# Patient Record
Sex: Male | Born: 1985 | Race: White | Hispanic: Yes | Marital: Married | Smoking: Never smoker
Health system: Southern US, Community
[De-identification: ages and names within clinical notes are randomized; demographics above are authoritative.]

## PROBLEM LIST (undated history)

## (undated) DIAGNOSIS — T7840XA Allergy, unspecified, initial encounter: Secondary | ICD-10-CM

## (undated) DIAGNOSIS — F419 Anxiety disorder, unspecified: Secondary | ICD-10-CM

## (undated) HISTORY — DX: Allergy, unspecified, initial encounter: T78.40XA

## (undated) HISTORY — DX: Anxiety disorder, unspecified: F41.9

---
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
## 2016-06-04 DIAGNOSIS — G8929 Other chronic pain: Secondary | ICD-10-CM | POA: Insufficient documentation

## 2016-06-04 DIAGNOSIS — R109 Unspecified abdominal pain: Secondary | ICD-10-CM

## 2016-06-09 DIAGNOSIS — Z7689 Persons encountering health services in other specified circumstances: Secondary | ICD-10-CM | POA: Diagnosis not present

## 2016-06-09 DIAGNOSIS — J069 Acute upper respiratory infection, unspecified: Secondary | ICD-10-CM | POA: Diagnosis not present

## 2016-06-19 DIAGNOSIS — R05 Cough: Secondary | ICD-10-CM | POA: Diagnosis not present

## 2016-06-19 DIAGNOSIS — F411 Generalized anxiety disorder: Secondary | ICD-10-CM | POA: Diagnosis not present

## 2016-06-19 DIAGNOSIS — R454 Irritability and anger: Secondary | ICD-10-CM | POA: Diagnosis not present

## 2016-06-19 DIAGNOSIS — Z201 Contact with and (suspected) exposure to tuberculosis: Secondary | ICD-10-CM | POA: Diagnosis not present

## 2016-08-10 ENCOUNTER — Ambulatory Visit (INDEPENDENT_AMBULATORY_CARE_PROVIDER_SITE_OTHER): Payer: BLUE CROSS/BLUE SHIELD | Admitting: Physician Assistant

## 2016-08-10 ENCOUNTER — Encounter: Payer: Self-pay | Admitting: Physician Assistant

## 2016-08-10 VITALS — BP 120/80 | HR 67 | Temp 98.5°F | Ht 70.0 in | Wt 158.5 lb

## 2016-08-10 DIAGNOSIS — K219 Gastro-esophageal reflux disease without esophagitis: Secondary | ICD-10-CM | POA: Diagnosis not present

## 2016-08-10 DIAGNOSIS — F419 Anxiety disorder, unspecified: Secondary | ICD-10-CM | POA: Insufficient documentation

## 2016-08-10 DIAGNOSIS — R053 Chronic cough: Secondary | ICD-10-CM | POA: Insufficient documentation

## 2016-08-10 DIAGNOSIS — R05 Cough: Secondary | ICD-10-CM | POA: Diagnosis not present

## 2016-08-10 DIAGNOSIS — T7840XA Allergy, unspecified, initial encounter: Secondary | ICD-10-CM

## 2016-08-10 MED ORDER — RANITIDINE HCL 150 MG PO TABS: 150.0000 mg | ORAL_TABLET | Freq: Two times a day (BID) | ORAL | 1 refills | Status: DC

## 2016-08-10 MED ORDER — SERTRALINE HCL 25 MG PO TABS
25.0000 mg | ORAL_TABLET | Freq: Every day | ORAL | 0 refills | Status: DC
Start: 2016-08-10 — End: 2016-09-04

## 2016-08-10 NOTE — Assessment & Plan Note (Signed)
Uncontrolled. Avoid triggers which appear to be meat and spicy foods. Continue fiber rich foods and supplement. Also add probiotic to help with bloating. Ranitidine 150 mg BID. Follow-up in 1 month.

## 2016-08-10 NOTE — Assessment & Plan Note (Signed)
Take daily antihistamine to help with post-nasal drip as this may be contributing to cough.

## 2016-08-10 NOTE — Assessment & Plan Note (Signed)
Overall improved, however patient is requesting reassurance. Will start Zantac and daily antihistamine. Referral to pulmonology for further evaluation and treatment. Most recent labs in March 2018 normal, as well as xray.

## 2016-08-10 NOTE — Progress Notes (Signed)
Leslie Rollins is a 31 y.o. male here to Establish Care and discuss anxiety issue, cough and abdominal bloating.  I acted as a Neurosurgeonscribe for [employer] CorporationSamantha Courtland Reas, PA-C Corky Mullonna Orphanos, LPN  History of Present Illness:   Chief Complaint  Patient presents with  . Establish Care  . Anxiety  . Bloated    x few years, diarrhea off and on  . Cough    x 3 months, expectorating clear to yellow sputum    Acute Concerns: GERD -- he notes that over the past few years, he notices that he has heartburn/bloating/diarrhea when he eats meats and spicy foods, will have a few episodes after just one meal of spicy foods or meat, if he avoids theses foods he doesn't have symptoms, he is trying to transition to veganism, he eats a lot of fruits and vegetables and does take at least one scoop of Metamucil daily to prevent constipation and hemorrhoids -- both of which he had deal with in the past, no current concerns for hemorrhoids, denies any rectal bleeding or weight loss  Chronic Issues: Cough -- has had cough for about 3 months since [place], while he was there he had an exposure to a niece with TB, his xray was negative on 06/19/16, was given amoxicillin as he as having low-grade fevers at one point. His cough has significantly improved however he is concerned because it has not resolved, he still has coughing spells 2-3 times a day. He has tried taking Mucinex and anti-histamines but doesn't help with the cough consistently and he doesn't like to take daily medications. Denies significant weight loss (usually weighs around 155-165 lb), no coughing up blood. Has "always" had night sweats. CBC, CMP in March 2018 were normal. Anxiety -- has been dealing with for years, has been prescribed a total of 3 medications, one of which was Lexapro 10mg  -- took it once and it caused a migraine, the other two medications he cannot remember. He is dealing with marriage struggles, denies suicidal thoughts or  homicidal thoughts, does raise his voice, son is autistic. [facility] but never went. He is agreeable to therapy and trying a medication to help with his anxiety. CBC, CMP and TSH in March 2018 were normal.  Health Maintenance: Immunizations -- UTD, we are requesting his records Colonoscopy -- none Diet -- very occasional cheese and meat, but tries to eat mostly vegan Caffeine intake -- limits coffee because it can cause anxiety symptoms, states that he can drink "2 red bulls and feel fine" Sleep habits -- has occasional insomnia with his anxiety Weight -- Weight: 158 lb 8 oz (71.9 kg)  Mood -- anxiety, no depression  Depression screen PHQ 2/9 08/10/2016  Decreased Interest 0  Down, Depressed, Hopeless 0  PHQ - 2 Score 0    Other providers/specialists: None   PMHx, SurgHx, SocialHx, Medications, and Allergies were reviewed in the Visit Navigator and updated as appropriate.  Current Medications:   Current Outpatient Prescriptions:  .  acetaminophen (TYLENOL) 500 MG tablet, Take 500-1,000 mg by mouth as needed., Disp: , Rfl:  .  cetirizine (ZYRTEC) 10 MG tablet, Take 10 mg by mouth as needed for allergies., Disp: , Rfl:  .  ranitidine (ZANTAC) 150 MG tablet, Take 1 tablet (150 mg total) by mouth 2 (two) times daily., Disp: 60 tablet, Rfl: 1 .  sertraline (ZOLOFT) 25 MG tablet, Take 1 tablet (25 mg total) by mouth daily.,  Disp: 30 tablet, Rfl: 0   Review of Systems:   Review of Systems  Constitutional: Negative.  Negative for chills, fever, malaise/fatigue and weight loss.  HENT:       Bilateral pressure in both ears, hears whistling off and on.  Eyes: Negative for blurred vision and redness.       Allergies  Respiratory: Positive for cough and sputum production.   Cardiovascular: Negative.   Gastrointestinal: Positive for diarrhea and heartburn. Negative for blood in stool, constipation and melena.       Bloating   Genitourinary: Negative.   Negative for dysuria.  Skin: Negative.   Neurological: Negative.   Endo/Heme/Allergies: Positive for environmental allergies.  Psychiatric/Behavioral: Negative for depression. The patient is nervous/anxious and has insomnia.     Vitals:   Vitals:   08/10/16 1453  BP: 120/80  Pulse: 67  Temp: 98.5 F (36.9 C)  TempSrc: Oral  SpO2: 96%  Weight: 158 lb 8 oz (71.9 kg)  Height: 5\' 10"  (1.778 m)     Body mass index is 22.74 kg/m.  Physical Exam:   Physical Exam  Constitutional: He appears well-developed. He is cooperative.  Non-toxic appearance. He does not have a sickly appearance. He does not appear ill. No distress.  Cardiovascular: Normal rate, regular rhythm, S1 normal, S2 normal, normal heart sounds and normal pulses.   No LE edema  Pulmonary/Chest: Effort normal and breath sounds normal.  Abdominal: Bowel sounds are normal. There is no tenderness. There is no rigidity, no rebound and no guarding.  Neurological: He is alert.  Nursing note and vitals reviewed.     Assessment and Plan:    Problem List Items Addressed This Visit      Digestive   Gastroesophageal reflux disease    Uncontrolled. Avoid triggers which appear to be meat and spicy foods. Continue fiber rich foods and supplement. Also add probiotic to help with bloating. Ranitidine 150 mg BID. Follow-up in 1 month.      Relevant Medications   ranitidine (ZANTAC) 150 MG tablet     Other   Anxiety    Un-controlled. Start Zoloft 25 mg daily. Follow-up with [place] in 1 month. Most recent labwork completed in March was normal, including TSH. I think that he would benefit from therapy, I encouraged he follow-up with [facility] referral, or I have given him Colen Darling' contact info.      Relevant Medications   sertraline (ZOLOFT) 25 MG tablet   Allergy    Take daily antihistamine to help with post-nasal drip as this may be contributing to cough.      Cough, persistent - Primary    Overall improved, however  patient is requesting reassurance. Will start Zantac and daily antihistamine. Referral to pulmonology for further evaluation and treatment. Most recent labs in March 2018 normal, as well as xray.      Relevant Orders   Ambulatory referral to Pulmonology      . Reviewed expectations re: course of current medical issues. . Discussed self-management of symptoms. . Outlined signs and symptoms indicating need for more acute intervention. . Patient verbalized understanding and all questions were answered. . See orders for this visit as documented in the electronic medical record. . Patient received an After-Visit Summary.  CMA or LPN served as scribe during this visit. History, Physical, and Plan performed by medical provider. Documentation and orders reviewed and attested to.  Jarold Motto, PA-C

## 2016-08-10 NOTE — Patient Instructions (Addendum)
It was great meeting you today!  You will be contacted about your referral to pulmonology.  Please go to the [facility] as we discussed for your therapy appointment --> [facility]  [address]  [place], [place] [id number]   OR please call Misty StanleyLisa, our therapist at [phone] to schedule an appointment.  We will see you in 1 month to check on your mood.  Start 25 mg Zoloft daily at night. Start 150 mg Ranitidine twice daily for heartburn. Start a daily antihistamine, such as Allegra, Claritin or Zyrtec Start a daily probiotic, such as Florastor, Vulturell or [employer].

## 2016-08-10 NOTE — Assessment & Plan Note (Addendum)
Un-controlled. Start Zoloft 25 mg daily. Follow-up with us in 1 month. Most recent labwork completed in March was normal, including TSH. I think that he would benefit from therapy, I encouraged he follow-up with [facility] referral, or I have given him Colen DarlingLisa Flores' contact info.

## 2016-09-03 NOTE — Progress Notes (Addendum)
Leslie Rollins is a 31 y.o. male is here to follow up on GERD and Anxiety.  I acted as a Neurosurgeon for [employer], PA-C Corky Mull, LPN  History of Present Illness:   Chief Complaint  Patient presents with  . Follow-up  . Gastroesophageal Reflux  . Anxiety    Gastroesophageal Reflux  He complains of belching, heartburn and nausea. He reports no choking, no dysphagia or no sore throat. Some heartburn but is better, sweating palms. This is a chronic problem. The problem occurs occasionally. The problem has been gradually improving. The heartburn duration is several minutes. The heartburn is located in the substernum. The heartburn is of moderate intensity. The heartburn does not wake him from sleep. The heartburn does not limit his activity. The heartburn doesn't change with position. The symptoms are aggravated by certain foods. Pertinent negatives include no weight loss. He has tried an antacid and a diet change (using Zantac as prescribed - twice daily; he is working on continual avoidance of meats as he transitions to veganism; he is also taking a probiotic that has helpled) for the symptoms. The treatment provided significant relief.  Anxiety  Presents for follow-up visit. Symptoms include nausea, nervous/anxious behavior and shortness of breath. Patient reports no decreased concentration, depressed mood, dizziness or dry mouth. Primary symptoms comment: Some heartburn but is better, sweating palms. Symptoms occur occasionally. The most recent episode lasted 5 minutes. The severity of symptoms is mild. The quality of sleep is fair. Nighttime awakenings: several.   Compliance with medications: taking 25mg  Zoloft daily and is responding well to treatment.   GAD 7 : Generalized Anxiety Score 09/04/2016  Nervous, Anxious, on Edge 1  Control/stop worrying 0  Worry too much - different things 1  Trouble relaxing 0  Restless 0  Easily annoyed or irritable 1  Afraid - awful  might happen 1  Total GAD 7 Score 4  Anxiety Difficulty Not difficult at all    Flank Pain Last Friday, drove to [place], had significant urinary urgency. Has never happened to him before. Had some pain on lower L side 10 minutes for severe and then dull for a days after this, approximately 2/10 on the pain scale. Two days ago his pain went away completely. 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 He does report that he has noticed that his urine was "very bright yellow" and endorses that he has had at least 6 Monster energy drinks over the weekend, but that this urine became clear once he switched to water. Denies penile discharge or lesions. He denies fevers, chills, nausea, issues with urinary burning/stinging or blood in urine. He denies history of kidney stones.  Additionally, he and his wife are traveling out of the country for the next month and a half and will be leaving in 3 days.   Health Maintenance Due  Topic Date Due  . HIV Screening  06/05/1998  . TETANUS/TDAP  06/05/2002    PMHx, SurgHx, SocialHx, FamHx, Medications, and Allergies were reviewed in the Visit Navigator and updated as appropriate.   Patient Active Problem List   Diagnosis Date Noted  . Anxiety 08/10/2016  . Allergy 08/10/2016  . Cough, persistent 08/10/2016  . Gastroesophageal reflux disease 08/10/2016    Social History  Substance Use Topics  . Smoking status: Never Smoker  . Smokeless tobacco: Never Used  . Alcohol use 2.4 - 3.6 oz/week  4 - 6 Cans of beer per week    Current Medications and Allergies:    Current Outpatient Prescriptions:  .  acetaminophen (TYLENOL) 500 MG tablet, Take 500-1,000 mg by mouth as needed., Disp: , Rfl:  .  cetirizine (ZYRTEC) 10 MG tablet, Take 10 mg by mouth as needed for allergies., Disp: , Rfl:  .  ranitidine (ZANTAC) 150 MG tablet, Take 1 tablet (150 mg total) by mouth at  bedtime., Disp: 90 tablet, Rfl: 1 .  sertraline (ZOLOFT) 50 MG tablet, Take 1 tablet (50 mg total) by mouth at bedtime., Disp: 90 tablet, Rfl: 1  No Known Allergies  Review of Systems   Review of Systems  Constitutional: Negative for chills, fever, malaise/fatigue and weight loss.  HENT: Negative for sore throat.   Respiratory: Positive for shortness of breath. Negative for choking.   Gastrointestinal: Positive for heartburn and nausea. Negative for dysphagia.  Genitourinary: Positive for flank pain and urgency. Negative for dysuria, frequency and hematuria.  Neurological: Negative for dizziness and headaches.  Psychiatric/Behavioral: Negative for decreased concentration. The patient is nervous/anxious.     Vitals:   Vitals:   09/04/16 1051  BP: 110/70  Pulse: (!) 54  Temp: 98.1 F (36.7 C)  TempSrc: Oral  SpO2: 98%  Weight: 156 lb 4 oz (70.9 kg)  Height: 5\' 10"  (1.778 m)     Body mass index is 22.42 kg/m.   Physical Exam:    Physical Exam  Constitutional: He appears well-developed. He is cooperative.  Non-toxic appearance. He does not have a sickly appearance. He does not appear ill. No distress.  Cardiovascular: Normal rate, regular rhythm, S1 normal, S2 normal, normal heart sounds and normal pulses.   No LE edema  Pulmonary/Chest: Effort normal and breath sounds normal.  Abdominal: There is no tenderness. There is CVA tenderness. There is no rigidity, no rebound and no guarding.  Possible tenderness with palpation of L-sided CVA  Musculoskeletal:  Tenderness to L lower back, no bony tenderness/rash/erythema present, no active spasm felt  Neurological: He is alert.  Nursing note and vitals reviewed.   Results for orders placed or performed in visit on 09/04/16  POCT urinalysis dipstick  Result Value Ref Range   Color, UA Yellow    Clarity, UA Clear    Glucose, UA Negative    Bilirubin, UA Negative    Ketones, UA Negative    Spec Grav, UA 1.010 1.010 - 1.025     Blood, UA Negative    pH, UA 6.0 5.0 - 8.0   Protein, UA Negative    Urobilinogen, UA 0.2 0.2 or 1.0 E.U./dL   Nitrite, UA Negative    Leukocytes, UA Negative Negative     Assessment and Plan:    Leslie Rollins was seen today for follow-up, gastroesophageal reflux and anxiety.  Diagnoses and all orders for this visit:  Dysuria POCT UA without abnormalities. He is currently asymptomatic. He is agreeable to cytology testing for GC/chlamydia. I discussed avoidance of Monster energy drinks and pushing fluids. I advised patient to let us know if symptoms return. -     POCT urinalysis dipstick -     Urine cytology ancillary only  Left flank pain POCT UA without abnormalities. He is currently asymptomatic. I suspect that patient had some sort of possible muscle strain. Additionally, I advised him to push fluids. May continue prn ibuprofen to help with symptoms. 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  I advised patient to let us know if symptoms return. -     POCT urinalysis dipstick -     Urine cytology ancillary only  Anxiety Well-controlled, however he would like to see if he can get any additional benefit from increase Zoloft dosage to 50 mg. I am agreeable to trying this. I have refilled the medication for him to take at 50 mg and advised him that while traveling he may decrease to 25 mg by splitting pills in half if he feels as though that is a better dosage for him. Avoid further energy drink intake.  Gastroesophageal reflux disease, esophagitis presence not specified Significantly improved with diet changes and zantac. I advised patient to decrease Zantac to 150 mg daily. Continue diet changes. Continue probiotic.  Other orders -     sertraline (ZOLOFT) 50 MG tablet; Take 1 tablet (50 mg total) by mouth at bedtime. -     ranitidine (ZANTAC) 150 MG tablet; Take 1 tablet (150 mg total) by mouth at  bedtime.  . Reviewed expectations re: course of current medical issues. . Discussed self-management of symptoms. . Outlined signs and symptoms indicating need for more acute intervention. . Patient verbalized understanding and all questions were answered. . See orders for this visit as documented in the electronic medical record. . Patient received an After Visit Summary.  CMA or LPN served as scribe during this visit. History, Physical, and Plan performed by medical provider. Documentation and orders reviewed and attested to.  Jarold MottoSamantha Treena Cosman, PA-C [place] [place] 09/04/2016  Follow-up: No Follow-up on file.

## 2016-09-04 ENCOUNTER — Encounter: Payer: Self-pay | Admitting: Physician Assistant

## 2016-09-04 ENCOUNTER — Other Ambulatory Visit (HOSPITAL_COMMUNITY)
Admission: RE | Admit: 2016-09-04 | Discharge: 2016-09-04 | Disposition: A | Discharge status: Home / Self Care | Payer: BLUE CROSS/BLUE SHIELD | Source: Ambulatory Visit | Attending: Physician Assistant | Admitting: Physician Assistant

## 2016-09-04 ENCOUNTER — Ambulatory Visit (INDEPENDENT_AMBULATORY_CARE_PROVIDER_SITE_OTHER): Payer: BLUE CROSS/BLUE SHIELD | Admitting: Physician Assistant

## 2016-09-04 VITALS — BP 110/70 | HR 54 | Temp 98.1°F | Ht 70.0 in | Wt 156.2 lb

## 2016-09-04 DIAGNOSIS — R300 Dysuria: Secondary | ICD-10-CM | POA: Insufficient documentation

## 2016-09-04 DIAGNOSIS — F419 Anxiety disorder, unspecified: Secondary | ICD-10-CM

## 2016-09-04 DIAGNOSIS — K219 Gastro-esophageal reflux disease without esophagitis: Secondary | ICD-10-CM

## 2016-09-04 DIAGNOSIS — R109 Unspecified abdominal pain: Secondary | ICD-10-CM | POA: Insufficient documentation

## 2016-09-04 LAB — POCT URINALYSIS DIPSTICK
Bilirubin, UA: NEGATIVE
Glucose, UA: NEGATIVE
KETONES UA: NEGATIVE
Leukocytes, UA: NEGATIVE
Nitrite, UA: NEGATIVE
PH UA: 6 (ref 5.0–8.0)
PROTEIN UA: NEGATIVE
RBC UA: NEGATIVE
SPEC GRAV UA: 1.01 (ref 1.010–1.025)
UROBILINOGEN UA: 0.2 U/dL

## 2016-09-04 MED ORDER — RANITIDINE HCL 150 MG PO TABS: 150.0000 mg | ORAL_TABLET | Freq: Every day | ORAL | 1 refills | Status: DC

## 2016-09-04 MED ORDER — SERTRALINE HCL 50 MG PO TABS: 50.0000 mg | ORAL_TABLET | Freq: Every day | ORAL | 1 refills | Status: DC

## 2016-09-04 NOTE — Patient Instructions (Addendum)
Decrease Zantac to 150 mg daily.  Increase Zoloft to 50 mg daily, if you feel that this is too much, you may cut the pill and take half daily while on your trip.  If your pain worsens in any way or if you experience changes in your urine, please let us know.

## 2016-09-07 LAB — URINE CYTOLOGY ANCILLARY ONLY
Chlamydia: NEGATIVE
Neisseria Gonorrhea: NEGATIVE

## 2016-11-17 ENCOUNTER — Telehealth: Payer: Self-pay | Admitting: Physician Assistant

## 2016-11-17 ENCOUNTER — Encounter: Payer: Self-pay | Admitting: Physician Assistant

## 2016-11-17 ENCOUNTER — Ambulatory Visit (INDEPENDENT_AMBULATORY_CARE_PROVIDER_SITE_OTHER): Payer: BLUE CROSS/BLUE SHIELD

## 2016-11-17 ENCOUNTER — Ambulatory Visit (INDEPENDENT_AMBULATORY_CARE_PROVIDER_SITE_OTHER): Payer: BLUE CROSS/BLUE SHIELD | Admitting: Physician Assistant

## 2016-11-17 VITALS — BP 120/70 | HR 60 | Temp 98.7°F | Ht 70.0 in | Wt 163.0 lb

## 2016-11-17 DIAGNOSIS — R109 Unspecified abdominal pain: Secondary | ICD-10-CM | POA: Diagnosis not present

## 2016-11-17 DIAGNOSIS — R079 Chest pain, unspecified: Secondary | ICD-10-CM

## 2016-11-17 DIAGNOSIS — F419 Anxiety disorder, unspecified: Secondary | ICD-10-CM

## 2016-11-17 LAB — POCT URINALYSIS DIPSTICK
Bilirubin, UA: NEGATIVE
Blood, UA: NEGATIVE
GLUCOSE UA: NEGATIVE
Ketones, UA: NEGATIVE
Leukocytes, UA: NEGATIVE
NITRITE UA: NEGATIVE
PH UA: 6.5 (ref 5.0–8.0)
PROTEIN UA: NEGATIVE
Urobilinogen, UA: 0.2 E.U./dL

## 2016-11-17 LAB — CBC WITH DIFFERENTIAL/PLATELET
BASOS ABS: 0.1 10*3/uL (ref 0.0–0.1)
Basophils Relative: 0.8 % (ref 0.0–3.0)
EOS ABS: 0.2 10*3/uL (ref 0.0–0.7)
Eosinophils Relative: 3.2 % (ref 0.0–5.0)
HEMATOCRIT: 44.5 % (ref 39.0–52.0)
HEMOGLOBIN: 15 g/dL (ref 13.0–17.0)
LYMPHS PCT: 28.6 % (ref 12.0–46.0)
Lymphs Abs: 1.8 10*3/uL (ref 0.7–4.0)
MCHC: 33.7 g/dL (ref 30.0–36.0)
MCV: 93.6 fl (ref 78.0–100.0)
Monocytes Absolute: 0.4 10*3/uL (ref 0.1–1.0)
Monocytes Relative: 5.8 % (ref 3.0–12.0)
Neutro Abs: 4 10*3/uL (ref 1.4–7.7)
Neutrophils Relative %: 61.6 % (ref 43.0–77.0)
Platelets: 175 10*3/uL (ref 150.0–400.0)
RBC: 4.76 Mil/uL (ref 4.22–5.81)
RDW: 12 % (ref 11.5–15.5)
WBC: 6.5 10*3/uL (ref 4.0–10.5)

## 2016-11-17 LAB — COMPREHENSIVE METABOLIC PANEL
ALBUMIN: 5 g/dL (ref 3.5–5.2)
ALT: 13 U/L (ref 0–53)
AST: 17 U/L (ref 0–37)
Alkaline Phosphatase: 47 U/L (ref 39–117)
BILIRUBIN TOTAL: 0.8 mg/dL (ref 0.2–1.2)
BUN: 7 mg/dL (ref 6–23)
CALCIUM: 9.7 mg/dL (ref 8.4–10.5)
CO2: 32 mEq/L (ref 19–32)
CREATININE: 0.95 mg/dL (ref 0.40–1.50)
Chloride: 103 mEq/L (ref 96–112)
GFR: 96.77 mL/min (ref >60.00)
Glucose, Bld: 86 mg/dL (ref 70–99)
Potassium: 4.2 mEq/L (ref 3.5–5.1)
Sodium: 140 mEq/L (ref 135–145)
TOTAL PROTEIN: 6.9 g/dL (ref 6.0–8.3)

## 2016-11-17 LAB — LIPASE: LIPASE: 31 U/L (ref 11.0–59.0)

## 2016-11-17 NOTE — Progress Notes (Signed)
Leslie Rollins is a 31 y.o. male here for Left flank pain.  I acted as a Neurosurgeon for [employer], PA-C Corky Mull, LPN  History of Present Illness:   Chief Complaint  Patient presents with  . Left flank pain  . Urinary Frequency  . Urinary Urgency    Flank Pain  This is a chronic (pt said left flank pain has not gone away .) problem. Episode onset: x 2-3 months. The problem occurs intermittently. The problem has been gradually worsening (past 7 days) since onset. The quality of the pain is described as aching. Radiates to: radiated to groin 2 days ago. The pain is at a severity of 1/10. The pain is mild. The pain is worse during the night. The symptoms are aggravated by bending, lying down and twisting. Associated symptoms include chest pain. Pertinent negatives include no abdominal pain, bladder incontinence, bowel incontinence, dysuria, fever, headaches, tingling or weight loss. (Nausea and dizziness past week, poor appetite.) He has tried nothing for the symptoms. The treatment provided no relief.   Does not drink [employer] drinks anymore. No ETOH this week. Was drinking quite a bit in [place] (was there for about 6 weeks) but has only had 1-2 drinks since returning home 3 weeks ago. Has had some diarrhea intermittently. Denies any issues with constipation.  Chest Pain About 5 days ago he was laughing with son, when he developed chest pain. Lasted 15 seconds. Had some shortness of breath. Radiated to neck. Wife thought he was having heart attack.  Has not returned since. He has had "multiple EKGs" in the past and all of them have been normal, I do not have any records of these. Denies any chest pain or SOB with activity.  Anxiety He is taking Zoloft 50 mg. Feels better but is interested in increasing to 75 mg to see if this will help with his symptoms. Denies any sexual issues, weight gain, headaches, or other side effects of the medication.   Past Medical History:   Diagnosis Date  . Allergy    Seasonal  . Anxiety      Social History   Social History  . Marital status: Married    Spouse name: N/A  . Number of children: N/A  . Years of education: N/A   Occupational History  . Event Manager    Social History Main Topics  . Smoking status: Never Smoker  . Smokeless tobacco: Never Used  . Alcohol use 2.4 - 3.6 oz/week    4 - 6 Cans of beer per week  . Drug use: No  . Sexual activity: Yes   Other Topics Concern  . Not on file   Social History Narrative   Son is autistic -- 43 years old   Married, considering divorce   Works as a Automotive engineer    Past Surgical History:  Procedure Laterality Date  . HERNIA REPAIR  1987   Umbilical    Family History  Problem Relation Age of Onset  . Heart disease Mother   . Heart disease Father   . Heart attack Father   . Autism Brother   . Obesity Sister     No Known Allergies  Current Medications:   Current Outpatient Prescriptions:  .  acetaminophen (TYLENOL) 500 MG tablet, Take 500-1,000 mg by mouth as needed., Disp: , Rfl:  .  cetirizine (ZYRTEC) 10 MG tablet, Take 10 mg by mouth as needed for allergies., Disp: , Rfl:  .  ranitidine (ZANTAC) 150 MG tablet, Take 1 tablet (150 mg total) by mouth at bedtime., Disp: 90 tablet, Rfl: 1 .  sertraline (ZOLOFT) 50 MG tablet, Take 1 tablet (50 mg total) by mouth at bedtime., Disp: 90 tablet, Rfl: 1   Review of Systems:   Review of Systems  Constitutional: Negative for chills, fever, malaise/fatigue and weight loss.  HENT: Negative for hearing loss.   Cardiovascular: Positive for chest pain.  Gastrointestinal: Negative for abdominal pain, bowel incontinence, heartburn, nausea and vomiting.  Genitourinary: Positive for flank pain. Negative for bladder incontinence and dysuria.  Neurological: Negative for dizziness, tingling, tremors and headaches.    Vitals:   Vitals:   11/17/16 1047  BP: 120/70  Pulse: 60  Temp: 98.7 F (37.1  C)  TempSrc: Oral  SpO2: 98%  Weight: 163 lb (73.9 kg)  Height: 5\' 10"  (1.778 m)     Body mass index is 23.39 kg/m.  Physical Exam:   Physical Exam  Constitutional: He appears well-developed. He is cooperative.  Non-toxic appearance. He does not have a sickly appearance. He does not appear ill. No distress.  Cardiovascular: Normal rate, regular rhythm, S1 normal, S2 normal, normal heart sounds and normal pulses.   No LE edema  Pulmonary/Chest: Effort normal and breath sounds normal.  Neurological: He is alert. He has normal strength. No cranial nerve deficit or sensory deficit. Coordination and gait normal. GCS eye subscore is 4. GCS verbal subscore is 5. GCS motor subscore is 6.  Skin: Skin is warm, dry and intact.  Psychiatric: He has a normal mood and affect. His speech is normal and behavior is normal.  Nursing note and vitals reviewed.  EKG tracing is personally reviewed.  EKG notes NSR.  No acute changes.   Results for orders placed or performed in visit on 11/17/16  POCT urinalysis dipstick  Result Value Ref Range   Color, UA light yellow    Clarity, UA clear    Glucose, UA Negative    Bilirubin, UA Negative    Ketones, UA Negative    Spec Grav, UA <=1.005 (A) 1.010 - 1.025   Blood, UA Negative    pH, UA 6.5 5.0 - 8.0   Protein, UA Negative    Urobilinogen, UA 0.2 0.2 or 1.0 E.U./dL   Nitrite, UA Negative    Leukocytes, UA Negative Negative   CLINICAL DATA:  Left flank pain for 2 months  EXAM: ABDOMEN - 1 VIEW  COMPARISON:  None.  FINDINGS: There are no disproportionally dilated loops of bowel. There is no obvious free intraperitoneal gas. No abnormal calcifications in the abdomen or pelvis. Bony framework is within normal limits.  IMPRESSION: Nonobstructive bowel gas pattern.   Electronically Signed   By: Jolaine Click M.D.   On: 11/17/2016 13:36  Assessment and Plan:    Singleton was seen today for left flank pain, urinary frequency and  urinary urgency.  Diagnoses and all orders for this visit:  Left flank pain Urine negative. Will check abdominal xray as well as CBC, CMP and lipase. Abd xray shows non-obstructive bowel/gas pattern, will have patient start miralax and probiotic. Consider abd u/s if symptoms persist. -     POCT urinalysis dipstick -     CBC with Differential/Platelet -     Comprehensive metabolic panel -     Lipase -     DG Abd 1 View; Future  Chest pain, unspecified type EKG tracing is personally reviewed.  EKG notes NSR.  No acute  changes. Suspect musculoskeletal or anxiety related. No exertional symptoms. Labs today. Follow-up if symptoms worsen or persist. -     EKG 12-Lead  Anxiety Would like to increase Zoloft to 75 mg to see if this helps. Follow-up if symptoms persist, to be seen in 3 months for follow-up for this.  . Reviewed expectations re: course of current medical issues. . Discussed self-management of symptoms. . Outlined signs and symptoms indicating need for more acute intervention. . Patient verbalized understanding and all questions were answered. . See orders for this visit as documented in the electronic medical record. . Patient received an After-Visit Summary.  CMA or LPN served as scribe during this visit. History, Physical, and Plan performed by medical provider. Documentation and orders reviewed and attested to.  Jarold MottoSamantha Labria Wos, PA-C

## 2016-11-17 NOTE — Patient Instructions (Signed)
Increase your Zoloft to 75 mg daily. Let me know if this doesn't work well for you.  Let's get an xray of your abdomen today, let's also get an ultrasound if indicated.  We will also get labwork done and call you with the results.  If your chest pain returns or you develop other symptoms -- please go to the ER.

## 2016-11-17 NOTE — Telephone Encounter (Signed)
Scheduled acute visit toda 08/14 at 10:30am for lower left flank pain.  -LL

## 2016-11-18 ENCOUNTER — Telehealth: Payer: Self-pay | Admitting: Physician Assistant

## 2016-11-18 NOTE — Telephone Encounter (Signed)
Patient called back for imaging results. No one available to take the call. Patient is available all day at his mobile. Please call him.

## 2016-11-18 NOTE — Telephone Encounter (Signed)
See result notes. 

## 2017-02-12 ENCOUNTER — Encounter: Payer: Self-pay | Admitting: Physician Assistant

## 2017-02-12 ENCOUNTER — Other Ambulatory Visit: Payer: Self-pay | Admitting: *Deleted

## 2017-02-12 ENCOUNTER — Other Ambulatory Visit (HOSPITAL_COMMUNITY)
Admission: RE | Admit: 2017-02-12 | Discharge: 2017-02-12 | Disposition: A | Discharge status: Home / Self Care | Payer: Medicaid Other | Source: Ambulatory Visit | Attending: Physician Assistant | Admitting: Physician Assistant

## 2017-02-12 ENCOUNTER — Ambulatory Visit (INDEPENDENT_AMBULATORY_CARE_PROVIDER_SITE_OTHER): Payer: Medicaid Other | Admitting: Physician Assistant

## 2017-02-12 ENCOUNTER — Ambulatory Visit (INDEPENDENT_AMBULATORY_CARE_PROVIDER_SITE_OTHER): Payer: Medicaid Other

## 2017-02-12 VITALS — BP 120/70 | HR 89 | Temp 98.5°F | Ht 70.0 in | Wt 152.4 lb

## 2017-02-12 DIAGNOSIS — G8929 Other chronic pain: Secondary | ICD-10-CM | POA: Diagnosis not present

## 2017-02-12 DIAGNOSIS — R1032 Left lower quadrant pain: Secondary | ICD-10-CM

## 2017-02-12 DIAGNOSIS — M79641 Pain in right hand: Secondary | ICD-10-CM

## 2017-02-12 DIAGNOSIS — Z113 Encounter for screening for infections with a predominantly sexual mode of transmission: Secondary | ICD-10-CM | POA: Diagnosis not present

## 2017-02-12 DIAGNOSIS — M2550 Pain in unspecified joint: Secondary | ICD-10-CM | POA: Diagnosis not present

## 2017-02-12 LAB — POCT URINALYSIS DIPSTICK
Bilirubin, UA: NEGATIVE
Glucose, UA: NEGATIVE
KETONES UA: NEGATIVE
Leukocytes, UA: NEGATIVE
Nitrite, UA: NEGATIVE
PH UA: 6 (ref 5.0–8.0)
PROTEIN UA: NEGATIVE
RBC UA: NEGATIVE
SPEC GRAV UA: 1.015 (ref 1.010–1.025)
UROBILINOGEN UA: 0.2 U/dL

## 2017-02-12 LAB — CBC WITH DIFFERENTIAL/PLATELET
BASOS ABS: 0 10*3/uL (ref 0.0–0.1)
BASOS PCT: 0.8 % (ref 0.0–3.0)
EOS ABS: 0.3 10*3/uL (ref 0.0–0.7)
Eosinophils Relative: 4.3 % (ref 0.0–5.0)
HEMATOCRIT: 43.5 % (ref 39.0–52.0)
HEMOGLOBIN: 14.4 g/dL (ref 13.0–17.0)
Lymphocytes Relative: 28.8 % (ref 12.0–46.0)
Lymphs Abs: 1.7 10*3/uL (ref 0.7–4.0)
MCHC: 33.1 g/dL (ref 30.0–36.0)
MCV: 93.7 fl (ref 78.0–100.0)
Monocytes Absolute: 0.3 10*3/uL (ref 0.1–1.0)
Monocytes Relative: 5.3 % (ref 3.0–12.0)
Neutro Abs: 3.7 10*3/uL (ref 1.4–7.7)
Neutrophils Relative %: 60.8 % (ref 43.0–77.0)
Platelets: 184 10*3/uL (ref 150.0–400.0)
RBC: 4.64 Mil/uL (ref 4.22–5.81)
RDW: 12 % (ref 11.5–15.5)
WBC: 6.1 10*3/uL (ref 4.0–10.5)

## 2017-02-12 MED ORDER — RANITIDINE HCL 150 MG PO TABS: 150.0000 mg | ORAL_TABLET | Freq: Every day | ORAL | 1 refills | Status: DC

## 2017-02-12 MED ORDER — SERTRALINE HCL 50 MG PO TABS: 50.0000 mg | ORAL_TABLET | Freq: Every day | ORAL | 1 refills | Status: DC

## 2017-02-12 NOTE — Progress Notes (Signed)
Leslie Rollins is a 31 y.o. male here for a recurrence of a previously resolved problem.  I acted as a Neurosurgeonscribe for [employer]antha Chassie Pennix, PA-C Corky Mullonna Orphanos, LPN  History of Present Illness:   Chief Complaint  Patient presents with  . Left Flank pain  . Joint Pain    Flank Pain  This is a chronic problem. Episode onset: x 6 months. The problem occurs constantly. The problem has been waxing and waning since onset. The quality of the pain is described as aching (at times pulsating). Radiates to: to LLQ. The pain is at a severity of 3/10. The pain is mild. The pain is the same all the time. The symptoms are aggravated by position. Pertinent negatives include no abdominal pain, bladder incontinence, bowel incontinence, fever, numbness or tingling. He has tried nothing for the symptoms. The treatment provided no relief.   When he holds his urine, bladder feels full and he gets a "bloating" sensation. Denies penile discharge, would like STD testing today.  Abd xray completed at prior visit with me on 11/17/16 revealed "non-obstructive bowel gas pattern." Denies pain with ejaculation or urine. Denies fever, chills.   Joint Pain Mainly in fingers or knees. Gets more painful with cold weather. R-handed. Does not take medicine to help with the pain. Stiffness is worse in the morning. R middle and R ring finger. Mom, cousins, aunts have arthritis -- he is worried that he has this. Denies fevers, swollen joints.   Past Medical History:  Diagnosis Date  . Allergy    Seasonal  . Anxiety      Social History   Socioeconomic History  . Marital status: Married    Spouse name: Not on file  . Number of children: Not on file  . Years of education: Not on file  . Highest education level: Not on file  Social Needs  . Financial resource strain: Not on file  . Food insecurity - worry: Not on file  . Food insecurity - inability: Not on file  . Transportation needs - medical: Not on file  .  Transportation needs - non-medical: Not on file  Occupational History  . 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 Smoking status: Never Smoker  . Smokeless tobacco: Never Used  Substance and Sexual Activity  . Alcohol use: Yes    Alcohol/week: 2.4 - 3.6 oz    Types: 4 - 6 Cans of beer per week  . Drug use: No  . Sexual activity: Yes  Other Topics Concern  . Not on file  Social History Narrative   Son is autistic -- 33 years old   Married, considering divorce   Works as a Automotive engineerDJ and event manager    Past Surgical History:  Procedure Laterality Date  . HERNIA REPAIR  1987   Umbilical    Family History  Problem Relation Age of Onset  . Heart disease Mother   . Heart disease Father   . Heart attack Father   . Autism Brother   . Obesity Sister     No Known Allergies  Current Medications:   Current Outpatient Medications:  .  acetaminophen (TYLENOL) 500 MG tablet, Take 500-1,000 mg by mouth as needed., Disp: , Rfl:  .  cetirizine (ZYRTEC) 10 MG tablet, Take 10 mg by mouth as needed for allergies., Disp: , Rfl:  .  ranitidine (ZANTAC) 150 MG tablet, Take 1 tablet (150 mg total) at bedtime by mouth., Disp: 90 tablet, Rfl: 1 .  sertraline (ZOLOFT) 50 MG tablet, Take 1 tablet (50 mg total) at bedtime by mouth., Disp: 90 tablet, Rfl: 1   Review of Systems:   Review of Systems  Constitutional: Negative for fever.  Gastrointestinal: Negative for abdominal pain and bowel incontinence.  Genitourinary: Positive for flank pain. Negative for bladder incontinence.  Neurological: Negative for tingling and numbness.    Vitals:   Vitals:   02/12/17 0837  BP: 120/70  Pulse: 89  Temp: 98.5 F (36.9 C)  TempSrc: Oral  SpO2: 98%  Weight: 152 lb 6.1 oz (69.1 kg)  Height: 5\' 10"  (1.778 m)     Body mass index is 21.86 kg/m.  Physical Exam:   Physical Exam  Constitutional: He appears well-developed. He is cooperative.  Non-toxic appearance. He does not have a sickly appearance.  He does not appear ill. No distress.  Cardiovascular: Normal rate, regular rhythm, S1 normal, S2 normal, normal heart sounds and normal pulses.  No LE edema  Pulmonary/Chest: Effort normal and breath sounds normal.  Abdominal: Normal appearance and bowel sounds are normal. There is no tenderness. There is no CVA tenderness.  Neurological: He is alert. GCS eye subscore is 4. GCS verbal subscore is 5. GCS motor subscore is 6.  Skin: Skin is warm, dry and intact.  Psychiatric: He has a normal mood and affect. His speech is normal and behavior is normal.  Nursing note and vitals reviewed.   Results for orders placed or performed in visit on 02/12/17  POCT urinalysis dipstick  Result Value Ref Range   Color, UA Yellow    Clarity, UA Clear    Glucose, UA Negative    Bilirubin, UA Negative    Ketones, UA Negative    Spec Grav, UA 1.015 1.010 - 1.025   Blood, UA Negative    pH, UA 6.0 5.0 - 8.0   Protein, UA Negative    Urobilinogen, UA 0.2 0.2 or 1.0 E.U./dL   Nitrite, UA Negative    Leukocytes, UA Negative Negative   CLINICAL DATA:  Intermittent middle and ring finger pain recent injury.  EXAM: RIGHT HAND - 2 VIEW  COMPARISON:  None.  FINDINGS: Remote healed fracture of the distal fifth metacarpal is noted. There is no acute bony or joint abnormality. Joint spaces and alignment are preserved. No erosion of, osteophytosis or periostitis. Soft tissues appear normal.  IMPRESSION: Negative for arthropathy or other finding to explain the patient's symptoms.  Remote healed fracture neck of the right fifth metacarpal.   Electronically Signed   By: Drusilla Kannerhomas  Dalessio M.D.   On: 02/12/2017 09:28  Assessment and Plan:    [name] was seen today for left flank pain and joint pain.  Diagnoses and all orders for this visit:  Chronic LLQ pain Provided reassurance. No red flags on exam and patient not in pain today. UA negative. Also discussed bladder training and voiding  diary. Briefly discussed case with Dr. Durene CalHunter. Will ask patient to work on bladder training and voiding diary x 2 weeks. If no improvement of symptoms or worsening, we will order CT abd/pelvis. Patient verbalized understanding. -     POCT urinalysis dipstick -     Urine cytology ancillary only -     CBC with Differential/Platelet  Screening for STD (sexually transmitted disease) Patient would like testing, will order this today. -     POCT urinalysis dipstick  Arthralgia, unspecified joint and Right hand pain Hand x-ray negative. Discussed use of Tylenol as needed for pain, will  avoid NSAIDs given hx of GERD. If pain persists, consider referral to Dr. Berline Chough. -     DG Hand 2 View Right; Future   . Reviewed expectations re: course of current medical issues. . Discussed self-management of symptoms. . Outlined signs and symptoms indicating need for more acute intervention. . Patient verbalized understanding and all questions were answered. . See orders for this visit as documented in the electronic medical record. . Patient received an After-Visit Summary.  CMA or LPN served as scribe during this visit. History, Physical, and Plan performed by medical provider. Documentation and orders reviewed and attested to.  Jarold Motto, PA-C

## 2017-02-12 NOTE — Patient Instructions (Signed)
Work on bladder training for 2 weeks. If no improvement in symptoms, we will order CT scan.  We will call you with lab work and your xray results.  Push fluids.

## 2017-02-15 LAB — URINE CYTOLOGY ANCILLARY ONLY
CHLAMYDIA, DNA PROBE: NEGATIVE
NEISSERIA GONORRHEA: NEGATIVE
TRICH (WINDOWPATH): NEGATIVE

## 2017-04-12 ENCOUNTER — Ambulatory Visit: Payer: Medicaid Other | Admitting: Family Medicine

## 2017-04-12 DIAGNOSIS — Z0289 Encounter for other administrative examinations: Secondary | ICD-10-CM

## 2017-04-19 ENCOUNTER — Other Ambulatory Visit: Payer: Self-pay

## 2017-04-19 ENCOUNTER — Encounter (HOSPITAL_BASED_OUTPATIENT_CLINIC_OR_DEPARTMENT_OTHER): Payer: Self-pay | Admitting: *Deleted

## 2017-04-19 ENCOUNTER — Emergency Department (HOSPITAL_BASED_OUTPATIENT_CLINIC_OR_DEPARTMENT_OTHER): Payer: Medicaid Other

## 2017-04-19 ENCOUNTER — Emergency Department (HOSPITAL_BASED_OUTPATIENT_CLINIC_OR_DEPARTMENT_OTHER)
Admission: EM | Admit: 2017-04-19 | Discharge: 2017-04-19 | Disposition: A | Discharge status: Home / Self Care | Payer: Medicaid Other | Attending: Emergency Medicine | Admitting: Emergency Medicine

## 2017-04-19 DIAGNOSIS — R109 Unspecified abdominal pain: Secondary | ICD-10-CM | POA: Diagnosis not present

## 2017-04-19 DIAGNOSIS — R1012 Left upper quadrant pain: Secondary | ICD-10-CM | POA: Diagnosis not present

## 2017-04-19 DIAGNOSIS — W268XXA Contact with other sharp object(s), not elsewhere classified, initial encounter: Secondary | ICD-10-CM | POA: Insufficient documentation

## 2017-04-19 DIAGNOSIS — R05 Cough: Secondary | ICD-10-CM | POA: Insufficient documentation

## 2017-04-19 DIAGNOSIS — Z23 Encounter for immunization: Secondary | ICD-10-CM | POA: Diagnosis not present

## 2017-04-19 DIAGNOSIS — Y998 Other external cause status: Secondary | ICD-10-CM | POA: Insufficient documentation

## 2017-04-19 DIAGNOSIS — Y9389 Activity, other specified: Secondary | ICD-10-CM | POA: Insufficient documentation

## 2017-04-19 DIAGNOSIS — S61210A Laceration without foreign body of right index finger without damage to nail, initial encounter: Secondary | ICD-10-CM | POA: Diagnosis not present

## 2017-04-19 DIAGNOSIS — Y929 Unspecified place or not applicable: Secondary | ICD-10-CM | POA: Insufficient documentation

## 2017-04-19 DIAGNOSIS — S6991XA Unspecified injury of right wrist, hand and finger(s), initial encounter: Secondary | ICD-10-CM | POA: Diagnosis not present

## 2017-04-19 DIAGNOSIS — Z79899 Other long term (current) drug therapy: Secondary | ICD-10-CM | POA: Diagnosis not present

## 2017-04-19 LAB — CBC WITH DIFFERENTIAL/PLATELET
BASOS ABS: 0 10*3/uL (ref 0.0–0.1)
BASOS PCT: 0 %
Eosinophils Absolute: 0.2 10*3/uL (ref 0.0–0.7)
Eosinophils Relative: 3 %
HCT: 39.1 % (ref 39.0–52.0)
HEMOGLOBIN: 13.7 g/dL (ref 13.0–17.0)
LYMPHS ABS: 1.9 10*3/uL (ref 0.7–4.0)
Lymphocytes Relative: 32 %
MCH: 31.4 pg (ref 26.0–34.0)
MCHC: 35 g/dL (ref 30.0–36.0)
MCV: 89.5 fL (ref 78.0–100.0)
Monocytes Absolute: 0.3 10*3/uL (ref 0.1–1.0)
Monocytes Relative: 5 %
NEUTROS PCT: 60 %
Neutro Abs: 3.6 10*3/uL (ref 1.7–7.7)
Platelets: 173 10*3/uL (ref 150–400)
RBC: 4.37 MIL/uL (ref 4.22–5.81)
RDW: 11.3 % — ABNORMAL LOW (ref 11.5–15.5)
WBC: 6 10*3/uL (ref 4.0–10.5)

## 2017-04-19 LAB — COMPREHENSIVE METABOLIC PANEL
ALBUMIN: 4.3 g/dL (ref 3.5–5.0)
ALK PHOS: 45 U/L (ref 38–126)
ALT: 12 U/L — AB (ref 17–63)
AST: 20 U/L (ref 15–41)
Anion gap: 4 — ABNORMAL LOW (ref 5–15)
BUN: 7 mg/dL (ref 6–20)
CALCIUM: 9 mg/dL (ref 8.9–10.3)
CHLORIDE: 106 mmol/L (ref 101–111)
CO2: 26 mmol/L (ref 22–32)
CREATININE: 0.96 mg/dL (ref 0.61–1.24)
GFR calc Af Amer: >60 mL/min (ref >60)
GFR calc non Af Amer: >60 mL/min (ref >60)
GLUCOSE: 93 mg/dL (ref 65–99)
Potassium: 3.7 mmol/L (ref 3.5–5.1)
SODIUM: 136 mmol/L (ref 135–145)
Total Bilirubin: 0.7 mg/dL (ref 0.3–1.2)
Total Protein: 7 g/dL (ref 6.5–8.1)

## 2017-04-19 LAB — URINALYSIS, ROUTINE W REFLEX MICROSCOPIC
BILIRUBIN URINE: NEGATIVE
Glucose, UA: NEGATIVE mg/dL
Hgb urine dipstick: NEGATIVE
Ketones, ur: NEGATIVE mg/dL
Leukocytes, UA: NEGATIVE
NITRITE: NEGATIVE
PROTEIN: NEGATIVE mg/dL
pH: 6 (ref 5.0–8.0)

## 2017-04-19 MED ORDER — CEPHALEXIN 500 MG PO CAPS: 500.0000 mg | ORAL_CAPSULE | Freq: Two times a day (BID) | ORAL | 0 refills | Status: AC

## 2017-04-19 MED ORDER — OMEPRAZOLE 20 MG PO CPDR: 20.0000 mg | DELAYED_RELEASE_CAPSULE | Freq: Every day | ORAL | 0 refills | Status: DC

## 2017-04-19 MED ORDER — RANITIDINE HCL 150 MG PO CAPS: 150.0000 mg | ORAL_CAPSULE | Freq: Two times a day (BID) | ORAL | 0 refills | Status: DC

## 2017-04-19 MED ORDER — TETANUS-DIPHTH-ACELL PERTUSSIS 5-2.5-18.5 LF-MCG/0.5 IM SUSP
0.5000 mL | Freq: Once | INTRAMUSCULAR | Status: AC
  Administered 2017-04-19: 0.5 mL via INTRAMUSCULAR
  Filled 2017-04-19: qty 0.5

## 2017-04-19 MED ORDER — LIDOCAINE HCL 2 % IJ SOLN
10.0000 mL | Freq: Once | INTRAMUSCULAR | Status: AC
  Administered 2017-04-19: 200 mg via INTRADERMAL
  Filled 2017-04-19: qty 20

## 2017-04-19 NOTE — ED Triage Notes (Signed)
Laceration to his right index finger while opening a can this am.  Left upper quad pain since March.

## 2017-04-19 NOTE — ED Provider Notes (Signed)
[facility] EMERGENCY DEPARTMENT Provider Note   CSN: [id number] Arrival date & time: 04/19/17  1135     History   Chief Complaint Chief Complaint  Patient presents with  . Laceration  . Abdominal Pain    HPI Leslie Rollins is a 32 y.o. male.  HPI  32 year old male with past medical history as below here with right index finger laceration.  The patient was opening a can today when his finger slipped.  He sustained a laceration to the right index finger.  He applied pressure and the bleeding stopped but he did have moderate bleeding at the time.  He has had sharp, stabbing, pain that is worse with any kind of movement since then.  Denies relieving factors.  Unknown last tetanus.  Otherwise, he does report intermittent left upper quadrant pain.  This is been ongoing for at least one year.  He has been worked up for this in the past.  Is not acutely worsened.  He has occasional cough when waking up at night.  Has been worked up for tuberculosis as well as referred to a PCP in the past, but has not followed up.  This has not specifically worsened.   Past Medical History:  Diagnosis Date  . Allergy    Seasonal  . Anxiety     Patient Active Problem List   Diagnosis Date Noted  . Anxiety 08/10/2016  . Allergy 08/10/2016  . Cough, persistent 08/10/2016  . Gastroesophageal reflux disease 08/10/2016    Past Surgical History:  Procedure Laterality Date  . HERNIA REPAIR  1987   Umbilical       Home Medications    Prior to Admission medications   Medication Sig Start Date End Date Taking? Authorizing Provider  sertraline (ZOLOFT) 50 MG tablet Take 1 tablet (50 mg total) at bedtime by mouth. 02/12/17  Yes Jarold MottoWorley, Samantha, PA  acetaminophen (TYLENOL) 500 MG tablet Take 500-1,000 mg by mouth as needed.    [provider]  cephALEXin (KEFLEX) 500 MG capsule Take 1 capsule (500 mg total) by mouth 2 (two) times daily for 5 days. 04/19/17 04/24/17  Shaune PollackIsaacs,  Syrina Wake, MD  cetirizine (ZYRTEC) 10 MG tablet Take 10 mg by mouth as needed for allergies.    [provider]  omeprazole (PRILOSEC) 20 MG capsule Take 1 capsule (20 mg total) by mouth daily for 14 days. In the morning, 30 minutes before eating/drinking 04/19/17 05/03/17  Shaune PollackIsaacs, Ansley Stanwood, MD  ranitidine (ZANTAC) 150 MG capsule Take 1 capsule (150 mg total) by mouth 2 (two) times daily for 14 days. 04/19/17 05/03/17  Shaune PollackIsaacs, Kolbee Bogusz, MD    Family History Family History  Problem Relation Age of Onset  . Heart disease Mother   . Heart disease Father   . Heart attack Father   . Autism Brother   . Obesity Sister     Social History Social History   Tobacco Use  . Smoking status: Never Smoker  . Smokeless tobacco: Never Used  Substance Use Topics  . Alcohol use: Yes    Alcohol/week: 2.4 - 3.6 oz    Types: 4 - 6 Cans of beer per week  . Drug use: No     Allergies   Patient has no known allergies.   Review of Systems Review of Systems  Constitutional: Positive for fatigue.  Gastrointestinal: Positive for abdominal pain.  Skin: Positive for wound.     Physical Exam Updated Vital Signs BP 118/90   Pulse 72  Temp 98.7 F (37.1 C) (Oral)   Resp 18   Ht 5\' 10"  (1.778 m)   Wt 67.6 kg (149 lb)   SpO2 100%   BMI 21.38 kg/m   Physical Exam  Constitutional: He is oriented to person, place, and time. He appears well-developed and well-nourished. No distress.  HENT:  Head: Normocephalic and atraumatic.  Eyes: Conjunctivae are normal.  Neck: Neck supple.  Cardiovascular: Normal rate, regular rhythm and normal heart sounds. Exam reveals no friction rub.  No murmur heard. Pulmonary/Chest: Effort normal and breath sounds normal. No respiratory distress. He has no wheezes. He has no rales.  Abdominal: He exhibits no distension.  Musculoskeletal: He exhibits no edema.  Neurological: He is alert and oriented to person, place, and time. He exhibits normal muscle tone.    Skin: Skin is warm. Capillary refill takes less than 2 seconds.  Psychiatric: He has a normal mood and affect.  Nursing note and vitals reviewed.   UPPER EXTREMITY EXAM: Right  INSPECTION & PALPATION: Right index finger with approximately 1.5 cm linear laceration along the radial aspect.  This does not involve the joint space.  No exposed bone or tendon.  SENSORY: Sensation is intact to light touch in:  Superficial radial nerve distribution (dorsal first web space) Median nerve distribution (tip of index finger)   Ulnar nerve distribution (tip of small finger)     MOTOR:  + Motor posterior interosseous nerve (thumb IP extension) + Anterior interosseous nerve (thumb IP flexion, index finger DIP flexion) + Radial nerve (wrist extension) + Median nerve (palpable firing thenar mass) + Ulnar nerve (palpable firing of first dorsal interosseous muscle)  VASCULAR: 2+ radial pulse Brisk capillary refill < 2 sec, fingers warm and well-perfused  TENDONS: Tested individual flexion at MCP, PIP and DIP joints of index fingers and all are intact. Tested extension of DIP/PIP joints of index fingers and all intact.  ED Treatments / Results  Labs (all labs ordered are listed, but only abnormal results are displayed) Labs Reviewed  URINALYSIS, ROUTINE W REFLEX MICROSCOPIC - Abnormal; Notable for the following components:      Result Value   Specific Gravity, Urine <1.005 (*)    All other components within normal limits  CBC WITH DIFFERENTIAL/PLATELET - Abnormal; Notable for the following components:   RDW 11.3 (*)    All other components within normal limits  COMPREHENSIVE METABOLIC PANEL - Abnormal; Notable for the following components:   ALT 12 (*)    Anion gap 4 (*)    All other components within normal limits    EKG  EKG Interpretation None       Radiology Dg Abdomen Acute W/chest  Result Date: 04/19/2017 CLINICAL DATA:  Mid LEFT-sided abdominal pain EXAM: DG ABDOMEN ACUTE  W/ 1V CHEST COMPARISON:  Abdominal radiograph 11/17/2016 FINDINGS: Normal cardiac silhouette.  No effusion, infiltrate or No dilated loops of large or small bowel. Gas and stool in the rectum. No pathologic calcifications. No organomegaly. No acute osseous abnormality Pneumothorax. IMPRESSION: 1.  No acute cardiopulmonary process. 2. No acute abdominal findings. Electronically Signed   By: Genevive Bi M.D.   On: 04/19/2017 12:23    Procedures .Marland KitchenLaceration Repair Date/Time: 04/19/2017 4:02 PM Performed by: Shaune Pollack, MD Authorized by: Shaune Pollack, MD   Consent:    Consent obtained:  Verbal   Consent given by:  Patient   Risks discussed:  Infection, need for additional repair, pain, tendon damage, retained foreign body, vascular damage, poor cosmetic result,  poor wound healing and nerve damage   Alternatives discussed:  Referral and delayed treatment Anesthesia (see MAR for exact dosages):    Anesthesia method:  Local infiltration   Local anesthetic:  Lidocaine 1% WITH epi Laceration details:    Location: Right index finger.   Length (cm):  1.5 Repair type:    Repair type:  Simple Pre-procedure details:    Preparation:  Patient was prepped and draped in usual sterile fashion and imaging obtained to evaluate for foreign bodies Exploration:    Hemostasis achieved with:  Direct pressure   Wound exploration: wound explored through full range of motion and entire depth of wound probed and visualized   Treatment:    Area cleansed with:  Betadine   Amount of cleaning:  Extensive   Irrigation solution:  Sterile water   Irrigation method:  Pressure wash Skin repair:    Repair method:  Sutures   Suture size:  4-0   Suture material:  Prolene   Suture technique:  Simple interrupted   Number of sutures:  4 Approximation:    Approximation:  Close   Vermilion border: well-aligned   Post-procedure details:    Dressing:  Antibiotic ointment and adhesive bandage   Patient  tolerance of procedure:  Tolerated well, no immediate complications   (including critical care time)  Medications Ordered in ED Medications  Tdap (BOOSTRIX) injection 0.5 mL (0.5 mLs Intramuscular Given 04/19/17 1225)  lidocaine (XYLOCAINE) 2 % (with pres) injection 200 mg (200 mg Intradermal Given 04/19/17 1228)     Initial Impression / Assessment and Plan / ED Course  I have reviewed the triage vital signs and the nursing notes.  Pertinent labs & imaging results that were available during my care of the patient were reviewed by me and considered in my medical decision making (see chart for details).    32 year old male with past medical history as above here with right hand laceration as well as intermittent left upper quadrant pain.  For his laceration, there is no apparent joint or tendon involvement.  Laceration was thoroughly cleansed and was repaired at bedside.  Will place in splint given location, as well as placed on prophylactic Keflex for 5 days.  Advised regular wound care.  Will follow up in 10-14 days for suture removal.  Regarding his left upper quadrant pain, this is an ongoing issue.  He has no palpable hepatosplenomegaly on exam.  No overt abdominal tenderness.  His lab work is very reassuring.  He has been seen for this in the past.  Symptoms are consistent with possible GI etiology and will refer him to GI as he may ultimately need an EGD.  Otherwise, will place him on antacids and refer for outpatient follow-up.  Final Clinical Impressions(s) / ED Diagnoses   Final diagnoses:  Laceration of right index finger without foreign body without damage to nail, initial encounter  Left upper quadrant pain    ED Discharge Orders        Ordered    ranitidine (ZANTAC) 150 MG capsule  2 times daily     04/19/17 1413    omeprazole (PRILOSEC) 20 MG capsule  Daily     04/19/17 1413    cephALEXin (KEFLEX) 500 MG capsule  2 times daily     04/19/17 1415       Shaune Pollack, MD 04/19/17 1604

## 2017-04-19 NOTE — Discharge Instructions (Signed)
Follow-up in 10-14 days for suture removal  Wear the splint as often as possible. No heavy lifting/use of the hand until sutures are removed.  For your abdominal pain, I've provided a referral to a GI specialist. Take the prescribed medications to see if they can help.

## 2017-04-28 ENCOUNTER — Encounter: Payer: Self-pay | Admitting: Nurse Practitioner

## 2017-05-10 ENCOUNTER — Encounter: Payer: Self-pay | Admitting: Nurse Practitioner

## 2017-05-10 ENCOUNTER — Ambulatory Visit (INDEPENDENT_AMBULATORY_CARE_PROVIDER_SITE_OTHER): Payer: BLUE CROSS/BLUE SHIELD | Admitting: Nurse Practitioner

## 2017-05-10 ENCOUNTER — Other Ambulatory Visit (INDEPENDENT_AMBULATORY_CARE_PROVIDER_SITE_OTHER): Payer: BLUE CROSS/BLUE SHIELD

## 2017-05-10 VITALS — BP 110/64 | HR 68 | Ht 68.5 in | Wt 142.4 lb

## 2017-05-10 DIAGNOSIS — F419 Anxiety disorder, unspecified: Secondary | ICD-10-CM

## 2017-05-10 DIAGNOSIS — R109 Unspecified abdominal pain: Secondary | ICD-10-CM

## 2017-05-10 DIAGNOSIS — R195 Other fecal abnormalities: Secondary | ICD-10-CM

## 2017-05-10 DIAGNOSIS — R634 Abnormal weight loss: Secondary | ICD-10-CM | POA: Diagnosis not present

## 2017-05-10 LAB — TSH: TSH: 1.35 u[IU]/mL (ref 0.35–4.50)

## 2017-05-10 NOTE — Patient Instructions (Addendum)
If you are age 365 or older, your body mass index should be between 23-30. Your Body mass index is 21.33 kg/m. If this is out of the aforementioned range listed, please consider follow up with your Primary Care Provider.  If you are age 34 or younger, your body mass index should be between 19-25. Your Body mass index is 21.33 kg/m. If this is out of the aformentioned range listed, please consider follow up with your Primary Care Provider.   You have been scheduled for an endoscopy. Please follow written instructions given to you at your visit today. If you use inhalers (even only as needed), please bring them with you on the day of your procedure. Your physician has requested that you go to www.startemmi.com and enter the access code given to you at your visit today. This web site gives a general overview about your procedure. However, you should still follow specific instructions given to you by our office regarding your preparation for the procedure.  Your physician has requested that you go to the basement for the following lab work before leaving today: TSH  Follow up with Willette ClusterPaula Guenther, NP on 06/30/17 at 1:30 pm.  Thank you for choosing me and [facility].   Willette ClusterPaula Guenther, NP

## 2017-05-10 NOTE — Progress Notes (Signed)
Chief Complaint:  Weight loss, chronic diarrhea, left flank pain. Follow-up from [facility] visit  Referring Provider:   Jarold MottoSamantha Worley, PA.     ASSESSMENT AND PLAN;   1. 32 yo Hispanic male with several GI complaints. He has chronic loose stools present for ~ 6 years and stable. Suspect IBS.   2. Weight loss. By Epic he is down  20 pounds since May 2018. Made dietary changes a year ago but didn't start losing weight until August. He looks healthy, labs unremarkable. Having some marital stress right now. He suffers with anxiety. Hopefully the weight loss is not pathologic. He has night sweats but says these have been present for years.  -We discussed EGD. He understands that in the absence of other sx such as nausea that EGD low yield but that workup needs to start someone. He will be scheduled for EGD. The risks and benefits of EGD were discussed and the patient agrees to proceed.  -check a TSH -If above negative and weight loss persists consider CT scan  3. Anxiety, uses Marijuana to ease sx.    4. ? GERD. Gets occasional burning in chest, better with Zantac.   5. Left flank pain. This is chronic, exacerbated by certain movements and I suspect it is musculoskeletal in nature.   HPI:    Patient is a 32 year old male from [place] referred by PCP for weight loss. He has multiple GI complaints.   Weight down unintentionally 20 pounds since August 2018, ten pounds of that since early November.  One year ago patient changed diet to avoid meats and milk but didn't start losing weight until August. His appetite is good. Eats adequate amounts of food. He denies nausea. No dysphagia. He has chronic intermittent left flank / LUQ pain which started a year ago. Pain is low grade but certain movements exacerbate it. CMET and CBC mid Jan was unremarkable.   He gets occasional chest discomfort, improved with Zantac which he takes ~ 5 times a week. The discomfort is hard for him to  explain, weird feeling is best way he can describe it. He gets SOB and sweaty palms when anxious. He vapes marijuana to control anxiety.  Had EKGs in [place] and in [place] and all apparently normal.   Patient has chronic intermittent diarrhea, worse after spicy foods. Bowel changes started about 6 years ago while living in [place] and stable, actually better with diet changes he made last year. He has normal stools but 2/3 of time they are loose. Usually has a BM in am,  none remainder of day. No blood in stool. No known FMH of GI diseases / illnesses.   He gives a hx of hemorrhoids, uses prep H supp which help. No current hemorrhoidal problems.    Past Medical History:  Diagnosis Date  . Allergy    Seasonal  . Anxiety      Past Surgical History:  Procedure Laterality Date  . UMBILICAL HERNIA REPAIR  1987   Family History  Problem Relation Age of Onset  . Heart disease Mother   . Heart disease Father   . Heart attack Father   . Autism Brother   . Obesity Sister    Social History   Tobacco Use  . Smoking status: Never Smoker  . Smokeless tobacco: Never Used  Substance Use Topics  . Alcohol use: Yes    Alcohol/week: 2.4 - 3.6 oz    Types: 4 - 6  Cans of beer per week  . Drug use: Yes    Comment: Marjuana vapor for anxiety   Current Outpatient Medications  Medication Sig Dispense Refill  . ranitidine (ZANTAC) 150 MG capsule Take 1 capsule (150 mg total) by mouth 2 (two) times daily for 14 days. (Patient taking differently: Take 150 mg by mouth as needed. ) 28 capsule 0  . sertraline (ZOLOFT) 50 MG tablet Take 1 tablet (50 mg total) at bedtime by mouth. (Patient taking differently: Take 50 mg by mouth as needed. ) 90 tablet 1   No current facility-administered medications for this visit.    No Known Allergies   Review of Systems: Positive for night sweats for 10 years. Positive for back pain, cough, sore throat, occasional urinary urgency.  All other systems reviewed  and negative except where noted in HPI.    Physical Exam:    BP 110/64 (BP Location: Left Arm, Patient Position: Sitting, Cuff Size: Normal)   Pulse 68   Ht 5' 8.5" (1.74 m) Comment: height measured without shoes  Wt 142 lb 6 oz (64.6 kg)   BMI 21.33 kg/m  Constitutional:  Well-developed, Hispanic male in no acute distress. Psychiatric: Normal mood and affect. Behavior is normal. EENT: Pupils normal.  Conjunctivae are normal. No scleral icterus. Neck supple.  Cardiovascular: Normal rate, regular rhythm. No edema Pulmonary/chest: Effort normal and breath sounds normal. No wheezing, rales or rhonchi. Abdominal: Soft, nondistended. Nontender. Bowel sounds active throughout. There are no masses palpable. No hepatomegaly. Lymphadenopathy: No cervical adenopathy noted. Neurological: Alert and oriented to person place and time. Skin: Skin is warm and dry. No rashes noted.  Willette Cluster, NP  05/10/2017, 1:59 PM  Cc: Jarold Motto, [place]

## 2017-05-11 ENCOUNTER — Encounter: Payer: Self-pay | Admitting: Nurse Practitioner

## 2017-05-12 NOTE — Progress Notes (Signed)
Thank you for sending this case to me. I have reviewed the entire note, and the outlined plan seems appropriate.  Diarrhea does not sound malabsorptive, more like IBS.  Consider trial hyoscyamine. Amada JupiterHenry Danis, MD

## 2017-05-13 ENCOUNTER — Telehealth: Payer: Self-pay | Admitting: Nurse Practitioner

## 2017-05-13 NOTE — Telephone Encounter (Signed)
Patient advised of the normal TSH

## 2017-05-27 ENCOUNTER — Telehealth: Payer: Self-pay | Admitting: Physician Assistant

## 2017-05-27 NOTE — Telephone Encounter (Signed)
See note

## 2017-05-27 NOTE — Telephone Encounter (Signed)
Copied from CRM [phone]#[id number]. Topic: Quick Communication - Rx Refill/Question >> May 27, 2017  4:22 PM Alexander BergeronBarksdale, Harvey B wrote: Medication: sertraline (ZOLOFT) 50 MG tablet [[id number]][[id number]]    Has the patient contacted their pharmacy? No.   (Agent: If no, request that the patient contact the pharmacy for the refill.)   Preferred Pharmacy (with phone number or street name): [facility]   Agent: Please be advised that RX refills may take up to 3 business days. We ask that you follow-up with your pharmacy.

## 2017-05-28 MED ORDER — SERTRALINE HCL 50 MG PO TABS: 50.0000 mg | ORAL_TABLET | Freq: Every day | ORAL | 0 refills | Status: AC

## 2017-05-28 NOTE — Addendum Note (Signed)
Addended by: Jimmye NormanPHANOS, Dari Carpenito J on: 05/28/2017 09:37 AM   Modules accepted: Orders

## 2017-05-28 NOTE — Telephone Encounter (Signed)
Okay to refill x 3 months.  Jarold MottoSamantha Jennavieve Arrick PA-C 05/28/17

## 2017-05-28 NOTE — Telephone Encounter (Signed)
Leslie Rollins, pt requesting refill on Zoloft 50 mg. Last seen 02/2017, okay to refill ?

## 2017-06-02 ENCOUNTER — Encounter: Payer: BLUE CROSS/BLUE SHIELD | Admitting: Gastroenterology

## 2017-06-09 ENCOUNTER — Encounter: Payer: Self-pay | Admitting: Gastroenterology

## 2017-06-09 ENCOUNTER — Ambulatory Visit (AMBULATORY_SURGERY_CENTER): Payer: BLUE CROSS/BLUE SHIELD | Admitting: Gastroenterology

## 2017-06-09 VITALS — BP 101/58 | HR 68 | Temp 99.1°F | Resp 20 | Ht 68.0 in | Wt 142.0 lb

## 2017-06-09 DIAGNOSIS — R634 Abnormal weight loss: Secondary | ICD-10-CM | POA: Diagnosis present

## 2017-06-09 DIAGNOSIS — R109 Unspecified abdominal pain: Secondary | ICD-10-CM

## 2017-06-09 DIAGNOSIS — R197 Diarrhea, unspecified: Secondary | ICD-10-CM

## 2017-06-09 MED ORDER — SODIUM CHLORIDE 0.9 % IV SOLN: 500.0000 mL | Freq: Once | INTRAVENOUS | Status: AC

## 2017-06-09 NOTE — Progress Notes (Signed)
To PACU, VSS. Report to [email or web address] 

## 2017-06-09 NOTE — Patient Instructions (Signed)
YOU HAD AN ENDOSCOPIC PROCEDURE TODAY AT THE [facility]:   Refer to the procedure report that was given to you for any specific questions about what was found during the examination.  If the procedure report does not answer your questions, please call your gastroenterologist to clarify.  If you requested that your care partner not be given the details of your procedure findings, then the procedure report has been included in a sealed envelope for you to review at your convenience later.  YOU SHOULD EXPECT: Some feelings of bloating in the abdomen. Passage of more gas than usual.  Walking can help get rid of the air that was put into your GI tract during the procedure and reduce the bloating. If you had a lower endoscopy (such as a colonoscopy or flexible sigmoidoscopy) you may notice spotting of blood in your stool or on the toilet paper. If you underwent a bowel prep for your procedure, you may not have a normal bowel movement for a few days.  Please Note:  You might notice some irritation and congestion in your nose or some drainage.  This is from the oxygen used during your procedure.  There is no need for concern and it should clear up in a day or so.  SYMPTOMS TO REPORT IMMEDIATELY:   Following upper endoscopy (EGD)  Vomiting of blood or coffee ground material  New chest pain or pain under the shoulder blades  Painful or persistently difficult swallowing  New shortness of breath  Fever of 100F or higher  Black, tarry-looking stools  For urgent or emergent issues, a gastroenterologist can be reached at any hour by calling [phone].   DIET:  We do recommend a small meal at first, but then you may proceed to your regular diet.  Drink plenty of fluids but you should avoid alcoholic beverages for 24 hours.  ACTIVITY:  You should plan to take it easy for the rest of today and you should NOT DRIVE or use heavy machinery until tomorrow (because of the sedation medicines used  during the test).    FOLLOW UP: Our staff will call the number listed on your records the next business day following your procedure to check on you and address any questions or concerns that you may have regarding the information given to you following your procedure. If we do not reach you, we will leave a message.  However, if you are feeling well and you are not experiencing any problems, there is no need to return our call.  We will assume that you have returned to your regular daily activities without incident.  If any biopsies were taken you will be contacted by phone or by letter within the next 1-3 weeks.  Please call us at [phone] if you have not heard about the biopsies in 3 weeks.    SIGNATURES/CONFIDENTIALITY: You and/or your care partner have signed paperwork which will be entered into your electronic medical record.  These signatures attest to the fact that that the information above on your After Visit Summary has been reviewed and is understood.  Full responsibility of the confidentiality of this discharge information lies with you and/or your care-partner. 

## 2017-06-09 NOTE — Progress Notes (Signed)
Pt's states no medical or surgical changes since previsit or office visit. 

## 2017-06-09 NOTE — Op Note (Signed)
[facility] Patient Name: Leslie Rollins Procedure Date: 06/09/2017 2:21 PM MRN: [id number] Endoscopist: Sherilyn Cooter L. Myrtie Neither , MD Age: 32 Referring MD:  Date of Birth: 27-Aug-1985 Gender: Male Account #: [id number] Procedure:                Upper GI endoscopy Indications:              Abdominal pain in the left flank, Diarrhea, Weight                            loss Medicines:                Monitored Anesthesia Care Procedure:                Pre-Anesthesia Assessment:                           - Prior to the procedure, a History and Physical                            was performed, and patient medications and                            allergies were reviewed. The patient's tolerance of                            previous anesthesia was also reviewed. The risks                            and benefits of the procedure and the sedation                            options and risks were discussed with the patient.                            All questions were answered, and informed consent                            was obtained. Prior Anticoagulants: The patient has                            taken no previous anticoagulant or antiplatelet                            agents. ASA Grade Assessment: II - A patient with                            mild systemic disease. After reviewing the risks                            and benefits, the patient was deemed in                            satisfactory condition to undergo the procedure.  After obtaining informed consent, the endoscope was                            passed under direct vision. Throughout the                            procedure, the patient's blood pressure, pulse, and                            oxygen saturations were monitored continuously. The                            Endoscope was introduced through the mouth, and                            advanced to the second part of duodenum. The  upper                            GI endoscopy was accomplished without difficulty.                            The patient tolerated the procedure well. Scope In: Scope Out: Findings:                 The larynx was normal.                           The esophagus was normal.                           The stomach was normal.                           The cardia and gastric fundus were normal on                            retroflexion.                           The examined duodenum was normal. Biopsies for                            histology were taken with a cold forceps for                            evaluation of celiac disease. Complications:            No immediate complications. Estimated Blood Loss:     Estimated blood loss was minimal. Impression:               - Normal larynx.                           - Normal esophagus.                           - Normal stomach.                           -  Normal examined duodenum. Biopsied.                           The reported weight loss does not appear to be of                            GI origin.                           The diarrhea sounds like IBS.                           The flank pain sounds possibly urologic with                            associated urologic symptoms. Recommendation:           - Patient has a contact number available for                            emergencies. The signs and symptoms of potential                            delayed complications were discussed with the                            patient. Return to normal activities tomorrow.                            Written discharge instructions were provided to the                            patient.                           - Resume previous diet.                           - Continue present medications.                           - Await pathology results.                           - Return to primary care physician. Henry L. Myrtie Neitheranis, MD 06/09/2017 2:42:03  PM This report has been signed electronically.

## 2017-06-09 NOTE — Progress Notes (Signed)
Called to room to assist during endoscopic procedure.  Patient ID and intended procedure confirmed with present staff. Received instructions for my participation in the procedure from the performing physician.  

## 2017-06-10 ENCOUNTER — Telehealth: Payer: Self-pay | Admitting: *Deleted

## 2017-06-10 NOTE — Telephone Encounter (Signed)
  Follow up Call-  Call back number 06/09/2017  Post procedure Call Back phone  # [phone]  Permission to leave phone message Yes     Patient questions:  Do you have a fever, pain , or abdominal swelling? No. Pain Score  0 *  Have you tolerated food without any problems? Yes.    Have you been able to return to your normal activities? Yes.    Do you have any questions about your discharge instructions: Diet   No. Medications  No. Follow up visit  No.  Do you have questions or concerns about your Care? No.  Actions: * If pain score is 4 or above: No action needed, pain <4.

## 2017-06-15 ENCOUNTER — Encounter: Payer: Self-pay | Admitting: Gastroenterology

## 2017-06-18 ENCOUNTER — Telehealth: Payer: Self-pay | Admitting: *Deleted

## 2017-06-18 NOTE — Telephone Encounter (Signed)
Left message on voicemail to call office. Need to know if had Flu shot.

## 2017-06-30 ENCOUNTER — Ambulatory Visit: Payer: BLUE CROSS/BLUE SHIELD | Admitting: Nurse Practitioner

## 2017-07-08 ENCOUNTER — Encounter (HOSPITAL_BASED_OUTPATIENT_CLINIC_OR_DEPARTMENT_OTHER): Payer: Self-pay | Admitting: *Deleted

## 2017-07-08 ENCOUNTER — Other Ambulatory Visit: Payer: Self-pay

## 2017-07-08 ENCOUNTER — Emergency Department (HOSPITAL_BASED_OUTPATIENT_CLINIC_OR_DEPARTMENT_OTHER)
Admission: EM | Admit: 2017-07-08 | Discharge: 2017-07-08 | Disposition: A | Discharge status: Home / Self Care | Payer: BLUE CROSS/BLUE SHIELD | Attending: Emergency Medicine | Admitting: Emergency Medicine

## 2017-07-08 ENCOUNTER — Emergency Department (HOSPITAL_BASED_OUTPATIENT_CLINIC_OR_DEPARTMENT_OTHER): Payer: BLUE CROSS/BLUE SHIELD

## 2017-07-08 DIAGNOSIS — Z79899 Other long term (current) drug therapy: Secondary | ICD-10-CM | POA: Diagnosis not present

## 2017-07-08 DIAGNOSIS — R109 Unspecified abdominal pain: Secondary | ICD-10-CM

## 2017-07-08 DIAGNOSIS — G8929 Other chronic pain: Secondary | ICD-10-CM | POA: Diagnosis not present

## 2017-07-08 DIAGNOSIS — R1032 Left lower quadrant pain: Secondary | ICD-10-CM | POA: Insufficient documentation

## 2017-07-08 DIAGNOSIS — Y999 Unspecified external cause status: Secondary | ICD-10-CM | POA: Diagnosis not present

## 2017-07-08 DIAGNOSIS — S93402A Sprain of unspecified ligament of left ankle, initial encounter: Secondary | ICD-10-CM

## 2017-07-08 DIAGNOSIS — Y9302 Activity, running: Secondary | ICD-10-CM | POA: Diagnosis not present

## 2017-07-08 DIAGNOSIS — X500XXA Overexertion from strenuous movement or load, initial encounter: Secondary | ICD-10-CM | POA: Diagnosis not present

## 2017-07-08 DIAGNOSIS — S99912A Unspecified injury of left ankle, initial encounter: Secondary | ICD-10-CM | POA: Diagnosis not present

## 2017-07-08 DIAGNOSIS — Y929 Unspecified place or not applicable: Secondary | ICD-10-CM | POA: Insufficient documentation

## 2017-07-08 DIAGNOSIS — M25572 Pain in left ankle and joints of left foot: Secondary | ICD-10-CM | POA: Diagnosis not present

## 2017-07-08 LAB — URINALYSIS, MICROSCOPIC (REFLEX): SQUAMOUS EPITHELIAL / LPF: NONE SEEN

## 2017-07-08 LAB — URINALYSIS, ROUTINE W REFLEX MICROSCOPIC
Bilirubin Urine: NEGATIVE
Glucose, UA: NEGATIVE mg/dL
KETONES UR: NEGATIVE mg/dL
Leukocytes, UA: NEGATIVE
Nitrite: NEGATIVE
PROTEIN: NEGATIVE mg/dL
Specific Gravity, Urine: <1.005 — ABNORMAL LOW (ref 1.005–1.030)
pH: 7 (ref 5.0–8.0)

## 2017-07-08 NOTE — ED Provider Notes (Signed)
[facility] EMERGENCY DEPARTMENT Provider Note   CSN: [id number] Arrival date & time: 07/08/17  1908     History   Chief Complaint Chief Complaint  Patient presents with  . Ankle Injury    HPI Leslie Rollins is a 32 y.o. male.  Patient is a 32 year old male presenting today after an injury while chasing after his child when he twisted his ankle.  He has not been able to bear weight since that time and it is been swollen.  He states occasionally this will happen but is never been this severe before.  Secondly patient states for the last 1 year he is been dealing with a ongoing pain that does not seem to resolve in his left flank area that radiates down into his testicle.  Patient states approximately 1 year ago he was in his normal state of health, drove to [place] for his work and when he got out of the car developed a severe sudden pain in his left flank that was 10 out of 10 dropped him to his knees and a sudden urge to urinate.  He states since that time he has had ongoing pain which will occasionally be severe when the pain is not present he will have an itching sensation in the same area.  He has not noted any swelling of his testicle, dysuria but occasionally will have urgency.  The pain does not radiate down into his legs he does not have a cough, fever or shortness of breath.  He has followed up with her primary physician and is even seeing gastroenterology with negative EGDs and colonoscopies.  He states he has never received imaging but did have blood work which was normal and a urine test about a year ago.  He feels like it is getting a little bit worse and affects his daily living and normal routines but constant pain is only a 1 or 2 out of 10.  There is nothing he can do that makes the pain more severe it just happens randomly.  He denies any weight loss.  The history is provided by the patient.  Ankle Injury  This is a new problem. The current episode started  3 to 5 hours ago. The problem occurs constantly. The problem has not changed since onset.Associated symptoms comments: Left ankle pain and swelling.  Unable to bare weight.  . The symptoms are aggravated by standing and walking. The symptoms are relieved by rest and ice. He has tried nothing for the symptoms. The treatment provided no relief.    Past Medical History:  Diagnosis Date  . Allergy    Seasonal  . Anxiety     Patient Active Problem List   Diagnosis Date Noted  . Anxiety 08/10/2016  . Allergy 08/10/2016  . Cough, persistent 08/10/2016  . Gastroesophageal reflux disease 08/10/2016    Past Surgical History:  Procedure Laterality Date  . UMBILICAL HERNIA REPAIR  1987        Home Medications    Prior to Admission medications   Medication Sig Start Date End Date Taking? Authorizing Provider  sertraline (ZOLOFT) 50 MG tablet Take 1 tablet (50 mg total) by mouth at bedtime. 05/28/17  Yes Jarold Motto, PA  ranitidine (ZANTAC) 150 MG capsule Take 1 capsule (150 mg total) by mouth 2 (two) times daily for 14 days. Patient taking differently: Take 150 mg by mouth as needed.  04/19/17 05/10/17  Shaune Pollack, MD    Family History Family History  Problem  Relation Age of Onset  . Heart disease Mother   . Heart disease Father   . Heart attack Father   . Autism Brother   . Obesity Sister     Social History Social History   Tobacco Use  . Smoking status: Never Smoker  . Smokeless tobacco: Never Used  Substance Use Topics  . Alcohol use: Yes    Alcohol/week: 2.4 - 3.6 oz    Types: 4 - 6 Cans of beer per week  . Drug use: Yes    Comment: Marjuana vapor for anxiety     Allergies   Patient has no known allergies.   Review of Systems Review of Systems  All other systems reviewed and are negative.    Physical Exam Updated Vital Signs BP 112/85 (BP Location: Left Arm)   Pulse 80   Temp 99.1 F (37.3 C) (Oral)   Resp 20   Ht 5\' 9"  (1.753 m)   Wt 64.4 kg  (142 lb)   SpO2 100%   BMI 20.97 kg/m   Physical Exam  Constitutional: He is oriented to person, place, and time. He appears well-developed and well-nourished. No distress.  HENT:  Head: Normocephalic and atraumatic.  Mouth/Throat: Oropharynx is clear and moist.  Eyes: Pupils are equal, round, and reactive to light. Conjunctivae and EOM are normal.  Neck: Normal range of motion. Neck supple.  Cardiovascular: Normal rate, regular rhythm and intact distal pulses.  No murmur heard. Pulmonary/Chest: Effort normal and breath sounds normal. No respiratory distress. He has no wheezes. He has no rales.  Abdominal: Soft. He exhibits no distension. There is no tenderness. There is no rebound and no guarding.  Musculoskeletal: He exhibits tenderness. He exhibits no edema.       Left ankle: He exhibits decreased range of motion and swelling. Tenderness. Lateral malleolus tenderness found. No head of 5th metatarsal and no proximal fibula tenderness found. Achilles tendon normal.  Neurological: He is alert and oriented to person, place, and time.  Skin: Skin is warm and dry. No rash noted. No erythema.  Psychiatric: He has a normal mood and affect. His behavior is normal.  Nursing note and vitals reviewed.    ED Treatments / Results  Labs (all labs ordered are listed, but only abnormal results are displayed) Labs Reviewed  URINALYSIS, ROUTINE W REFLEX MICROSCOPIC - Abnormal; Notable for the following components:      Result Value   Specific Gravity, Urine <1.005 (*)    Hgb urine dipstick TRACE (*)    All other components within normal limits  URINALYSIS, MICROSCOPIC (REFLEX) - Abnormal; Notable for the following components:   Bacteria, UA RARE (*)    All other components within normal limits    EKG None  Radiology Dg Ankle Complete Left  Result Date: 07/08/2017 CLINICAL DATA:  32 y/o M; twisting injury with pain along the lateral ankle. EXAM: LEFT ANKLE COMPLETE - 3+ VIEW COMPARISON:   None. FINDINGS: Small calcified bodies are present lateral to the talus which may calcific tendinitis or a small avulsion fracture. No other fracture or dislocation identified. Talar dome is intact. Ankle mortise is symmetric. IMPRESSION: Small calcified bodies lateral to the talus may represent calcific tendinitis or a small avulsion fracture. No other fracture or dislocation identified. Electronically Signed   By: Mitzi HansenLance  Furusawa-Stratton M.D.   On: 07/08/2017 19:59   Ct Renal Stone Study  Result Date: 07/08/2017 CLINICAL DATA:  Left lower quadrant pain with recent worsening. EXAM: CT ABDOMEN AND  PELVIS WITHOUT CONTRAST TECHNIQUE: Multidetector CT imaging of the abdomen and pelvis was performed following the standard protocol without IV contrast. COMPARISON:  None. FINDINGS: Lower chest: Unremarkable Hepatobiliary: No focal abnormality in the liver on this study without intravenous contrast. There is no evidence for gallstones, gallbladder wall thickening, or pericholecystic fluid. No intrahepatic or extrahepatic biliary dilation. Pancreas: No focal mass lesion. No dilatation of the main duct. No intraparenchymal cyst. No peripancreatic edema. Spleen: No splenomegaly. No focal mass lesion. Adrenals/Urinary Tract: No adrenal nodule or mass. No evidence for renal or ureteral stones. Urinary bladder is distended without evidence for stones. No hydroureteronephrosis. Stomach/Bowel: Stomach is nondistended. No gastric wall thickening. No evidence of outlet obstruction. Duodenum is normally positioned as is the ligament of Treitz. No small bowel wall thickening. No small bowel dilatation. The terminal ileum is normal. The appendix is normal. No gross colonic mass. No colonic wall thickening. No substantial diverticular change. Vascular/Lymphatic: No abdominal aortic aneurysm. No abdominal aortic atherosclerotic calcification. There is no gastrohepatic or hepatoduodenal ligament lymphadenopathy. No intraperitoneal or  retroperitoneal lymphadenopathy. No pelvic sidewall lymphadenopathy. Reproductive: The prostate gland and seminal vesicles have normal imaging features. Other: No intraperitoneal free fluid. Musculoskeletal: Bone windows reveal no worrisome lytic or sclerotic osseous lesions. IMPRESSION: 1. No urinary stone disease. 2. Markedly distended urinary bladder. This may be physiologic, but correlation for urinary retention may prove helpful. 3. Otherwise unremarkable study. No findings to explain the patient's history of left lower quadrant pain. Electronically Signed   By: Kennith Center M.D.   On: 07/08/2017 20:14    Procedures Procedures (including critical care time)  Medications Ordered in ED Medications - No data to display   Initial Impression / Assessment and Plan / ED Course  I have reviewed the triage vital signs and the nursing notes.  Pertinent labs & imaging results that were available during my care of the patient were reviewed by me and considered in my medical decision making (see chart for details).     Presenting today with 2 issues.  Initially is an injury to his left ankle which is most likely a sprain while he was chasing his child.  It is swollen and tender over the lateral malleolus.  Low suspicion for proximal fracture.  Neurovascularly intact. X-ray pending.  Secondly patient has had ongoing symptoms for a year in his left flank radiating down into his pelvis.  Symptoms sounds like they could be related to renal stones versus mass versus possible thoracic pathology.  He denies any neurologic symptoms other than an itching sensation that he will get in his flank and side area the same place he has pain.  It does not seem to be reproduced with certain movements.  He has been seeing a physician and did have an extensive GI workup which was negative.  However he has not seen a urologist or had any imaging of his torso.  Is well-appearing here.  Pain is not reproducible.  However will  do a CT to rule out above.  Will also do a UA.  8:48 PM CT and UA without significant findings other than a distended bladder.  However patient urinated and bladder scan showed a volume of 99.  Low suspicion for retention.  Discussed findings with the patient and recommended follow-up with his PCP and possible thoracic and lumbar imaging as that may be the cause of his symptoms.  Also x-ray showed possible avulsion fracture but most likely just sprained.  Patient given crutches and placed  in an Aircast.  Final Clinical Impressions(s) / ED Diagnoses   Final diagnoses:  Sprain of left ankle, unspecified ligament, initial encounter  Flank pain, chronic    ED Discharge Orders    None       Gwyneth Sprout, MD 07/10/17 [phone]

## 2017-07-08 NOTE — ED Triage Notes (Signed)
Running and twisted his left ankle. Swelling noted.

## 2017-07-08 NOTE — Discharge Instructions (Signed)
Elevate and ice your leg.  It wear the brace any time you are up.  Use the crutches as needed.

## 2017-07-28 ENCOUNTER — Ambulatory Visit (INDEPENDENT_AMBULATORY_CARE_PROVIDER_SITE_OTHER): Payer: BLUE CROSS/BLUE SHIELD | Admitting: Physician Assistant

## 2017-07-28 ENCOUNTER — Ambulatory Visit (INDEPENDENT_AMBULATORY_CARE_PROVIDER_SITE_OTHER): Payer: BLUE CROSS/BLUE SHIELD

## 2017-07-28 ENCOUNTER — Encounter: Payer: Self-pay | Admitting: Physician Assistant

## 2017-07-28 VITALS — BP 116/70 | HR 62 | Temp 98.4°F | Ht 69.0 in | Wt 150.2 lb

## 2017-07-28 DIAGNOSIS — M25572 Pain in left ankle and joints of left foot: Secondary | ICD-10-CM

## 2017-07-28 DIAGNOSIS — S99912A Unspecified injury of left ankle, initial encounter: Secondary | ICD-10-CM | POA: Diagnosis not present

## 2017-07-28 NOTE — Patient Instructions (Addendum)
It was great to see you!  If pain is not improved after two weeks, we recommend following up [facility].  May take ibuprofen as needed.

## 2017-07-28 NOTE — Progress Notes (Signed)
Leslie Rollins is a 32 y.o. male here for a new problem.  I acted as a Neurosurgeon for [employer], PA-C Corky Mull, LPN  History of Present Illness:   Chief Complaint  Patient presents with  . Ankle Injury    Left    Ankle Injury   Incident onset: 3 weeks ago was seen in ED on 4/4 not getting any better. The incident occurred in the street. The injury mechanism was a twisting injury (was running and fell and twisted ankle). The pain is present in the left ankle. The quality of the pain is described as aching and shooting. Pain scale: 2-3 at rest and then goes to 8-10 with movement and walking. Pain severity now: mild at rest and severe with movement and walking. The pain has been constant since onset. Associated symptoms include an inability to bear weight, a loss of motion and muscle weakness. He reports no foreign bodies present. The symptoms are aggravated by movement and weight bearing. He has tried ice, immobilization, rest and NSAIDs (Has crutches and air cast at home from ED) for the symptoms. The treatment provided moderate relief.   He has been using the boot and crutches periodically, but not consistently.   X-ray on 07/08/17 IMPRESSION: Small calcified bodies lateral to the talus may represent calcific tendinitis or a small avulsion fracture. No other fracture or dislocation identified.  Past Medical History:  Diagnosis Date  . Allergy    Seasonal  . Anxiety      Social History   Socioeconomic History  . Marital status: Married    Spouse name: Not on file  . Number of children: 1  . Years of education: Not on file  . Highest education level: Not on file  Occupational History  . Occupation: Ship broker  Social Needs  . Financial resource strain: Not on file  . Food insecurity:    Worry: Not on file    Inability: Not on file  . Transportation needs:    Medical: Not on file    Non-medical: Not on file  Tobacco Use  . Smoking status: Never Smoker   . Smokeless tobacco: Never Used  Substance and Sexual Activity  . Alcohol use: Yes    Alcohol/week: 2.4 - 3.6 oz    Types: 4 - 6 Cans of beer per week  . Drug use: Yes    Comment: Marjuana vapor for anxiety  . Sexual activity: Yes  Lifestyle  . Physical activity:    Days per week: Not on file    Minutes per session: Not on file  . Stress: Not on file  Relationships  . Social connections:    Talks on phone: Not on file    Gets together: Not on file    Attends religious service: Not on file    Active member of club or organization: Not on file    Attends meetings of clubs or organizations: Not on file    Relationship status: Not on file  . Intimate partner violence:    Fear of current or ex partner: Not on file    Emotionally abused: Not on file    Physically abused: Not on file    Forced sexual activity: Not on file  Other Topics Concern  . Not on file  Social History Narrative   Son is autistic -- 36 years old   Married, considering divorce   Works as a Automotive engineer    Past Surgical History:  Procedure  Laterality Date  . UMBILICAL HERNIA REPAIR  1987    Family History  Problem Relation Age of Onset  . Heart disease Mother   . Heart disease Father   . Heart attack Father   . Autism Brother   . Obesity Sister     No Known Allergies  Current Medications:   Current Outpatient Medications:  .  ranitidine (ZANTAC) 150 MG tablet, Take 150 mg by mouth as needed for heartburn., Disp: , Rfl:  .  sertraline (ZOLOFT) 50 MG tablet, Take 1 tablet (50 mg total) by mouth at bedtime., Disp: 90 tablet, Rfl: 0  Current Facility-Administered Medications:  .  0.9 %  sodium chloride infusion, 500 mL, Intravenous, Once, Danis, Starr LakeHenry L III, MD   Review of Systems:   ROS  Negative unless otherwise specified per HPI.  Vitals:   Vitals:   07/28/17 0931  BP: 116/70  Pulse: 62  Temp: 98.4 F (36.9 C)  TempSrc: Oral  SpO2: 96%  Weight: 150 lb 4 oz (68.2 kg)   Height: 5\' 9"  (1.753 m)     Body mass index is 22.19 kg/m.  Physical Exam:   Physical Exam  Constitutional: He is oriented to person, place, and time. He appears well-developed and well-nourished.  HENT:  Head: Normocephalic.  Neck: Normal range of motion.  Pulmonary/Chest: Effort normal.  Musculoskeletal:  Limited ROM with flexion and extension of L ankle. Mild swelling to lateral malleolus. Pain with palpation of PTF > ATF and CF. Decreased strength with resisted eversion and inversion of L foot. No ecchymosis or erythema.  Neurological: He is alert and oriented to person, place, and time.  Skin: Skin is warm and dry.  Nursing note and vitals reviewed.  L ankle xray -- normal  Assessment and Plan:    Leslie Rollins was seen today for ankle injury.  Diagnoses and all orders for this visit:  Acute left ankle pain -     DG Ankle Complete Left; Future   L ankle xray -- without abnormalities. Patient was given ankle exercises provided by our in-office Athletic Trainer, Christoper FabianMolly Weber. Recommended prn ibuprofen, elevation. I recommended follow-up with Dr. Berline Choughigby if symptoms worsen or persist.  . Reviewed expectations re: course of current medical issues. . Discussed self-management of symptoms. . Outlined signs and symptoms indicating need for more acute intervention. . Patient verbalized understanding and all questions were answered. . See orders for this visit as documented in the electronic medical record. . Patient received an After-Visit Summary.  CMA or LPN served as scribe during this visit. History, Physical, and Plan performed by medical provider. Documentation and orders reviewed and attested to.  Jarold MottoSamantha Biddie Sebek, PA-C

## 2017-07-29 ENCOUNTER — Ambulatory Visit: Payer: BLUE CROSS/BLUE SHIELD | Admitting: Physician Assistant

## 2017-08-28 DIAGNOSIS — S39012A Strain of muscle, fascia and tendon of lower back, initial encounter: Secondary | ICD-10-CM | POA: Diagnosis not present

## 2018-02-16 ENCOUNTER — Encounter: Payer: Self-pay | Admitting: *Deleted

## 2019-06-19 IMAGING — CT CT RENAL STONE PROTOCOL
2 of 4 series · 15 of 46 positions shown, 17 images · non-contrast
Comparison: None.

CLINICAL DATA: Left lower quadrant pain with recent worsening.

EXAM:
CT ABDOMEN AND PELVIS WITHOUT CONTRAST
TECHNIQUE: Multidetector CT imaging of the abdomen and pelvis was performed
following the standard protocol without IV contrast.

[Series 2: axial st · axial · 0.68mm/px · z∈[-470,-50]mm · 12 of 92 slices shown, 14 images]
[im 4/92  soft-tissue]
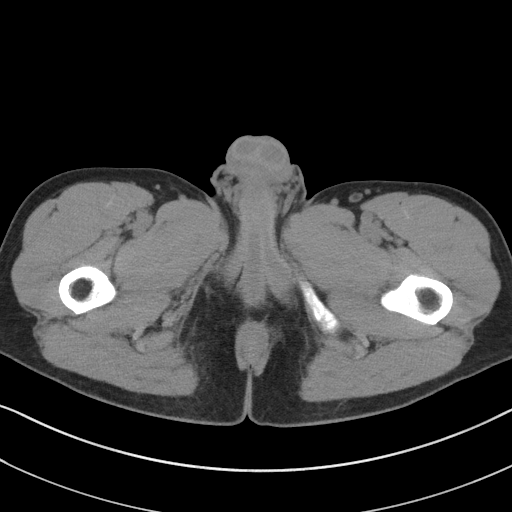
[im 4/92  bone]
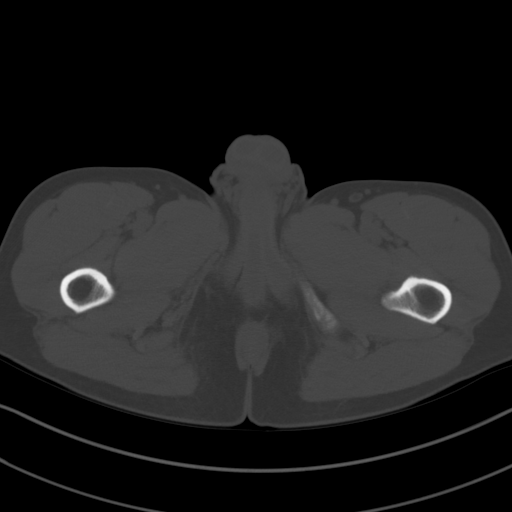
[im 12/92  soft-tissue]
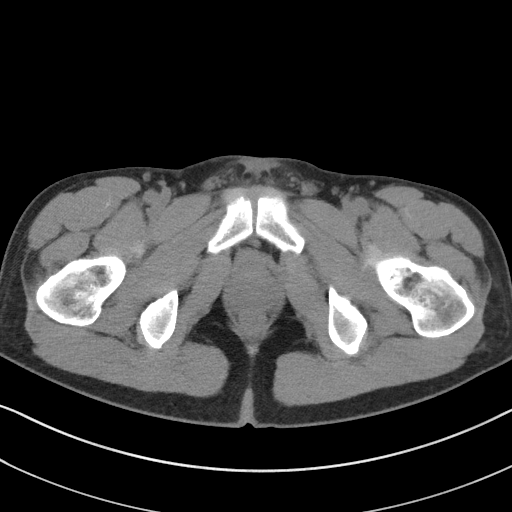
[im 19/92  soft-tissue]
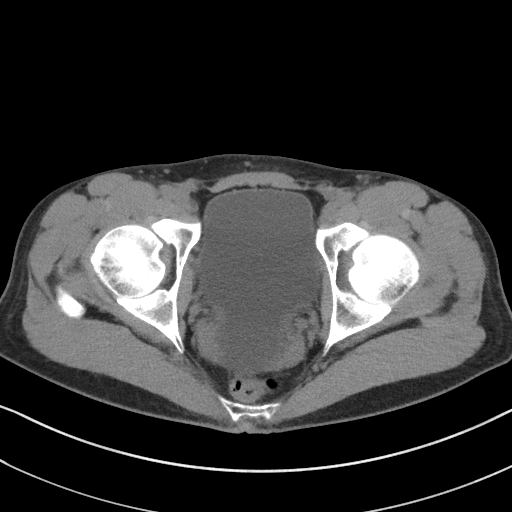
[im 27/92  soft-tissue]
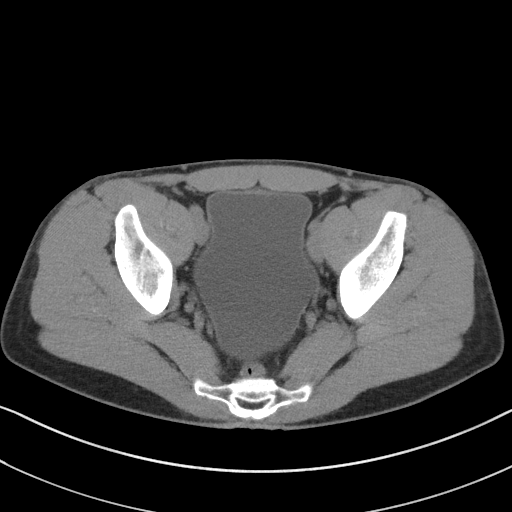
[im 35/92  soft-tissue]
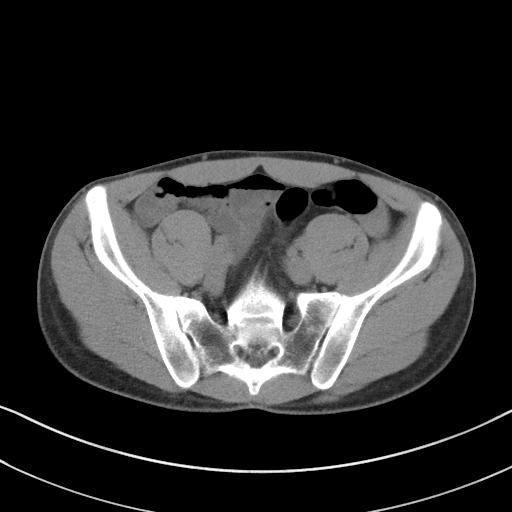
[im 42/92  soft-tissue]
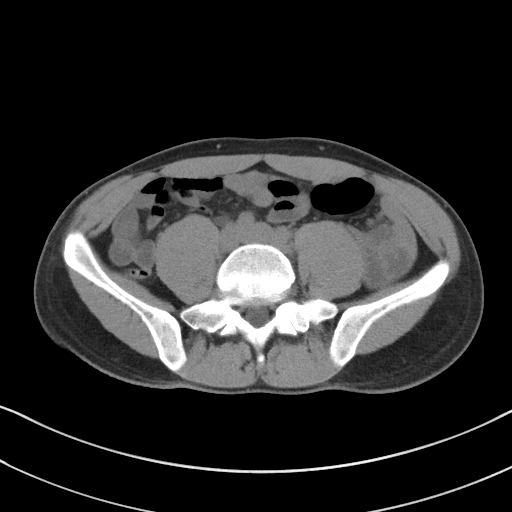
[im 50/92  soft-tissue]
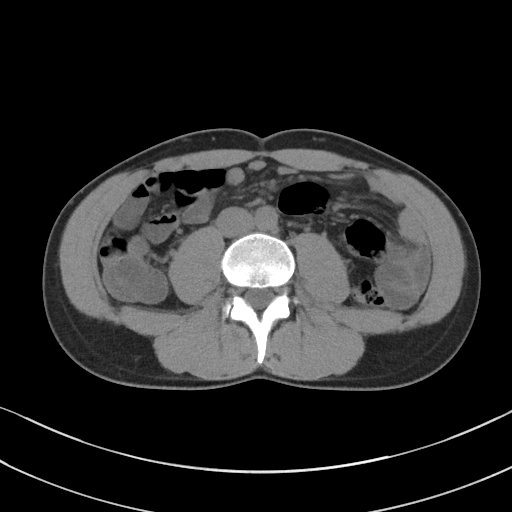
[im 57/92  soft-tissue]
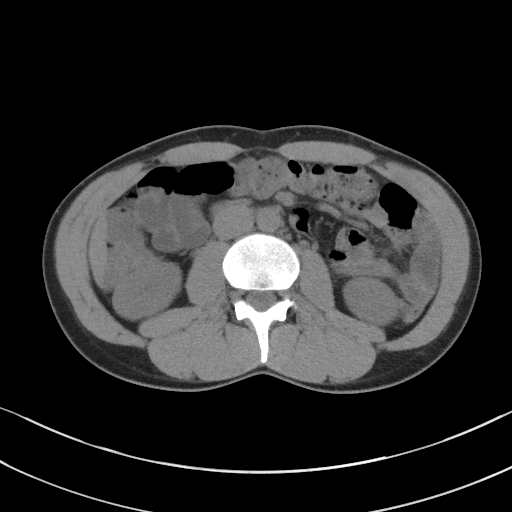
[im 65/92  soft-tissue]
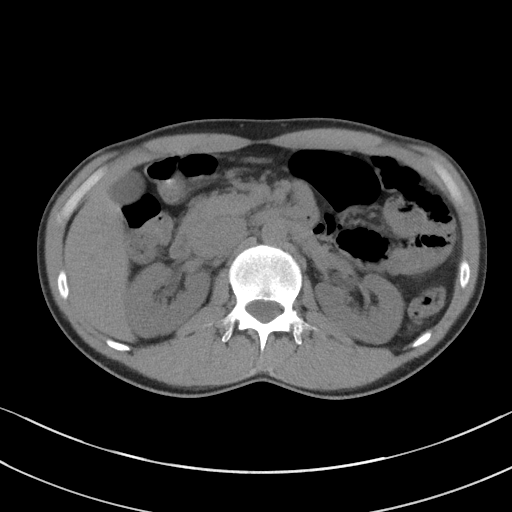
[im 65/92  bone]
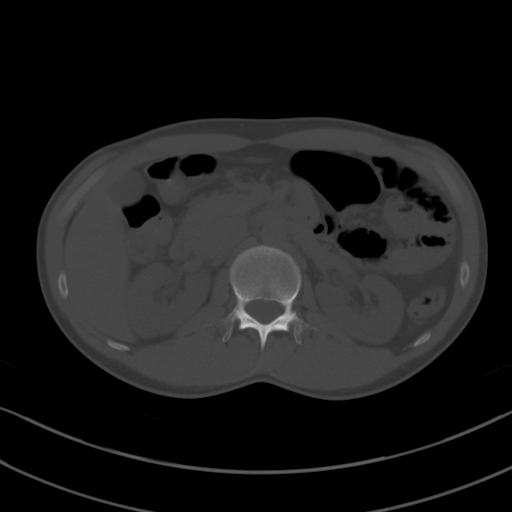
[im 73/92  soft-tissue]
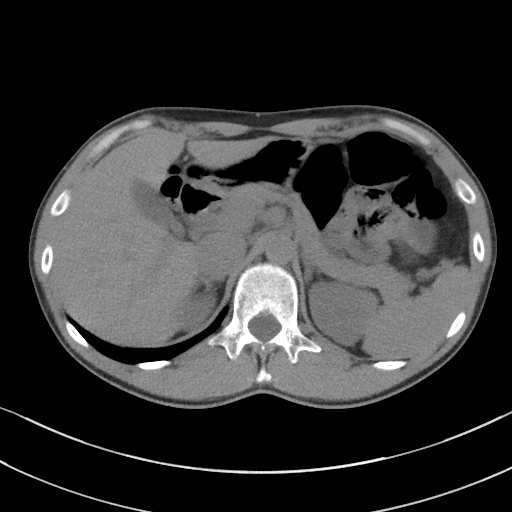
[im 80/92  soft-tissue]
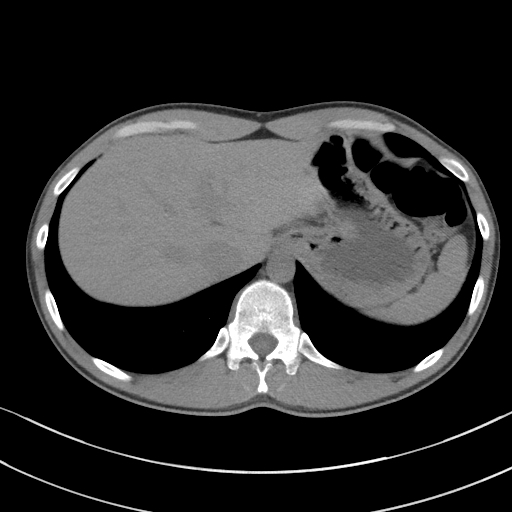
[im 88/92  soft-tissue]
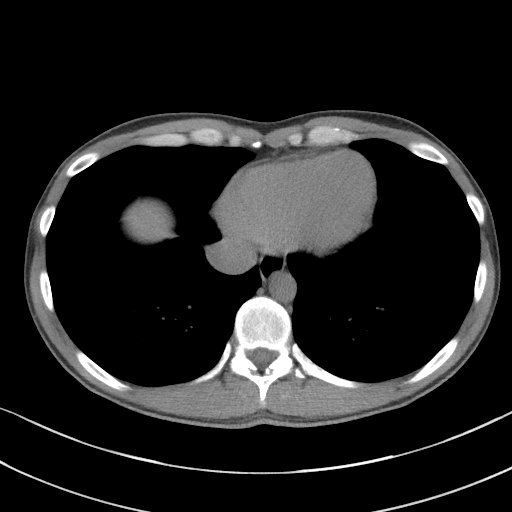

[Series 5: coronal st · coronal · 0.67mm/px · 3 of 75 slices shown]
[im 25/75  soft-tissue]
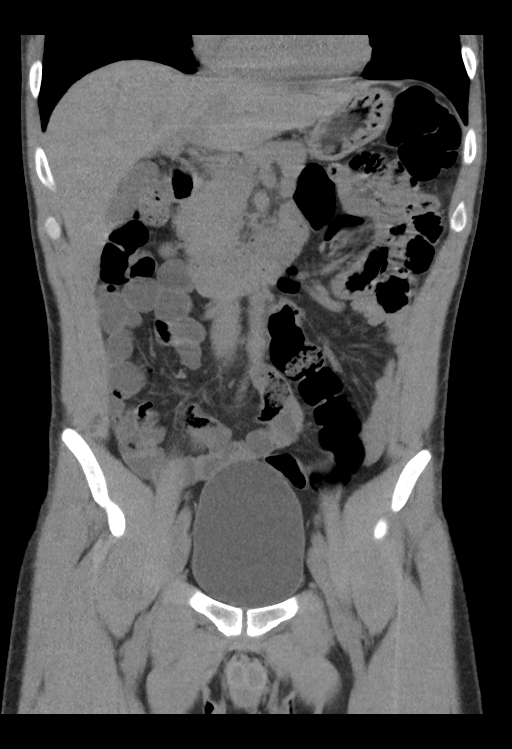
[im 33/75  soft-tissue]
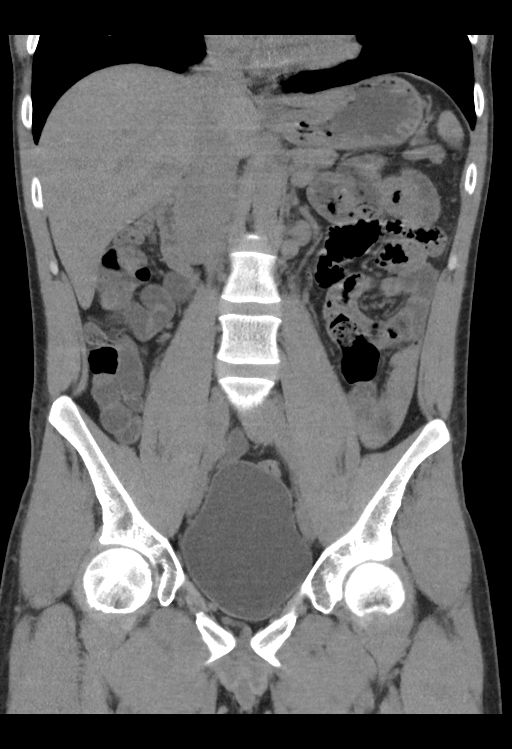
[im 42/75  soft-tissue]
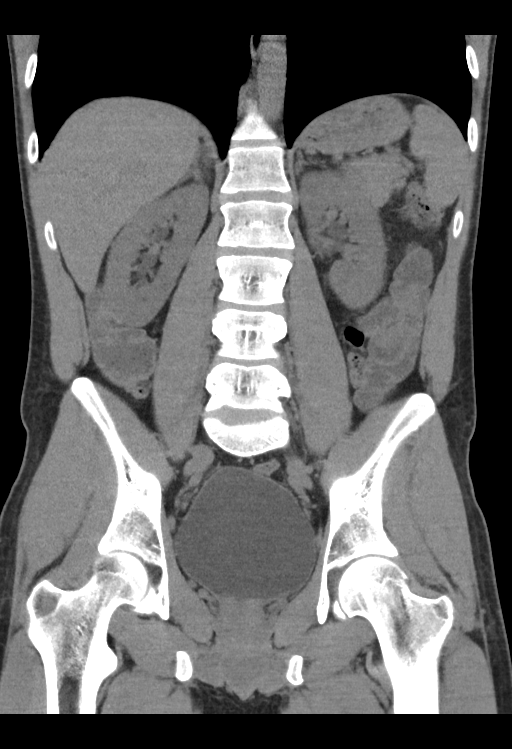

[15 of 46 positions shown; findings below may reference images not displayed]

FINDINGS: Lower chest: Unremarkable

Hepatobiliary: No focal abnormality in the liver on this study
without intravenous contrast. There is no evidence for gallstones,
gallbladder wall thickening, or pericholecystic fluid. No
intrahepatic or extrahepatic biliary dilation.

Pancreas: No focal mass lesion. No dilatation of the main duct. No
intraparenchymal cyst. No peripancreatic edema.

Spleen: No splenomegaly. No focal mass lesion.

Adrenals/Urinary Tract: No adrenal nodule or mass. No evidence for
renal or ureteral stones. Urinary bladder is distended without
evidence for stones. No hydroureteronephrosis.

Stomach/Bowel: Stomach is nondistended. No gastric wall thickening.
No evidence of outlet obstruction. Duodenum is normally positioned
as is the ligament of Treitz. No small bowel wall thickening. No
small bowel dilatation. The terminal ileum is normal. The appendix
is normal. No gross colonic mass. No colonic wall thickening. No
substantial diverticular change.

Vascular/Lymphatic: No abdominal aortic aneurysm. No abdominal
aortic atherosclerotic calcification. There is no gastrohepatic or
hepatoduodenal ligament lymphadenopathy. No intraperitoneal or
retroperitoneal lymphadenopathy. No pelvic sidewall lymphadenopathy.

Reproductive: The prostate gland and seminal vesicles have normal
imaging features.

Other: No intraperitoneal free fluid.

Musculoskeletal: Bone windows reveal no worrisome lytic or sclerotic
osseous lesions.
IMPRESSION: 1. No urinary stone disease.
2. Markedly distended urinary bladder. This may be physiologic, but
correlation for urinary retention may prove helpful.
3. Otherwise unremarkable study. No findings to explain the
patient's history of left lower quadrant pain.

## 2019-06-19 IMAGING — CR DG ANKLE COMPLETE 3+V*L*
3 series · 3 of 3 positions shown · non-contrast
Comparison: None.

CLINICAL DATA: 32 y/o M; twisting injury with pain along the
lateral ankle.

EXAM:
LEFT ANKLE COMPLETE - 3+ VIEW

[t ankle joint ap left]
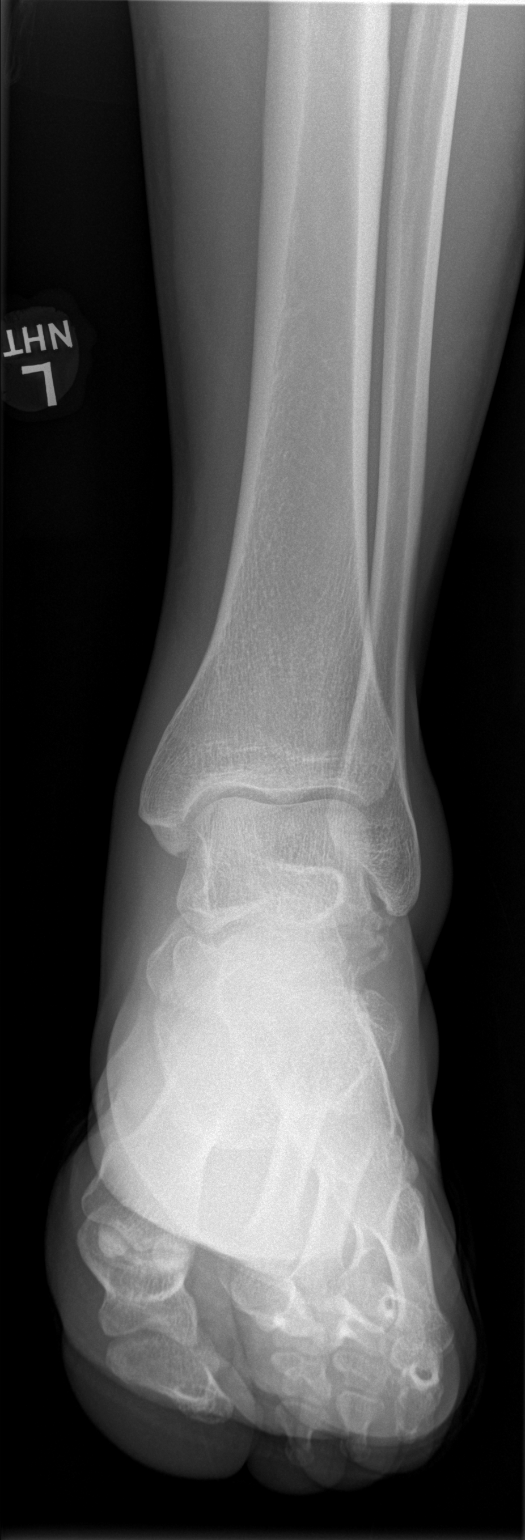

[t ankle joint oblique left]
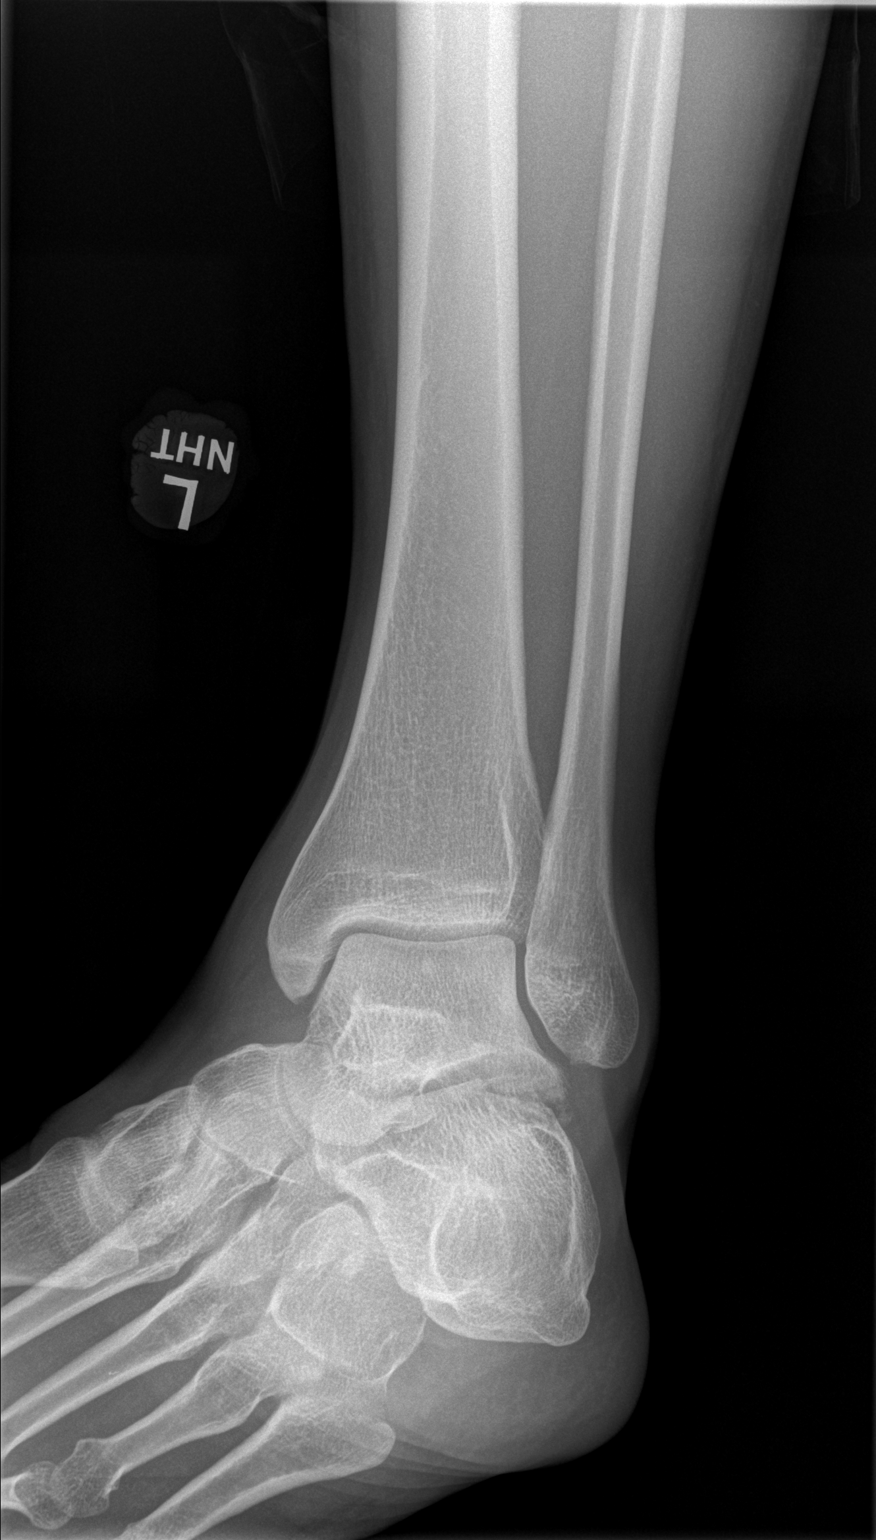

[t ankle joint lat left]
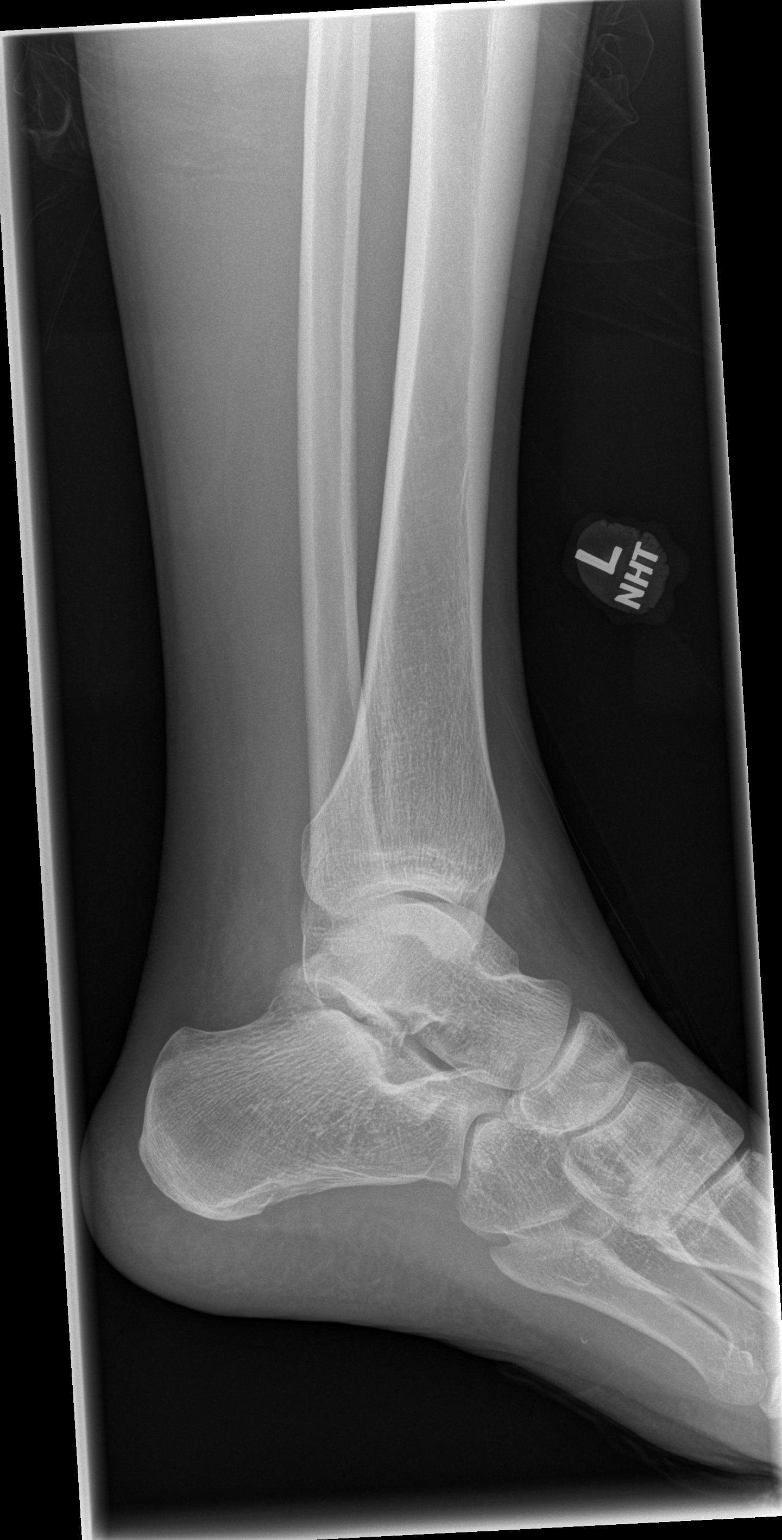

[3 of 3 positions shown; findings below may reference images not displayed]

FINDINGS: Small calcified bodies are present lateral to the talus which may
calcific tendinitis or a small avulsion fracture. No other fracture
or dislocation identified. Talar dome is intact. Ankle mortise is
symmetric.
IMPRESSION: Small calcified bodies lateral to the talus may represent calcific
tendinitis or a small avulsion fracture. No other fracture or
dislocation identified.

By: Drey Jim M.D.

## 2021-12-29 ENCOUNTER — Encounter: Payer: Self-pay | Admitting: *Deleted
